# Patient Record
Sex: Female | Born: 1975 | Race: White | Hispanic: No | Marital: Married | Smoking: Current every day smoker
Health system: Southern US, Community
[De-identification: ages and names within clinical notes are randomized; demographics above are authoritative.]

## PROBLEM LIST (undated history)

## (undated) DIAGNOSIS — F32A Depression, unspecified: Secondary | ICD-10-CM

## (undated) DIAGNOSIS — F419 Anxiety disorder, unspecified: Secondary | ICD-10-CM

## (undated) DIAGNOSIS — F3110 Bipolar disorder, current episode manic without psychotic features, unspecified: Secondary | ICD-10-CM

## (undated) DIAGNOSIS — M797 Fibromyalgia: Secondary | ICD-10-CM

## (undated) DIAGNOSIS — J45909 Unspecified asthma, uncomplicated: Secondary | ICD-10-CM

## (undated) DIAGNOSIS — E119 Type 2 diabetes mellitus without complications: Secondary | ICD-10-CM

## (undated) DIAGNOSIS — C189 Malignant neoplasm of colon, unspecified: Secondary | ICD-10-CM

## (undated) DIAGNOSIS — F29 Unspecified psychosis not due to a substance or known physiological condition: Secondary | ICD-10-CM

## (undated) HISTORY — DX: Unspecified psychosis not due to a substance or known physiological condition: F29

## (undated) HISTORY — DX: Depression, unspecified: F32.A

## (undated) HISTORY — DX: Malignant neoplasm of colon, unspecified: C18.9

## (undated) HISTORY — DX: Anxiety disorder, unspecified: F41.9

## (undated) HISTORY — DX: Type 2 diabetes mellitus without complications: E11.9

---

## 2021-12-12 ENCOUNTER — Inpatient Hospital Stay
Admission: EM | Admit: 2021-12-12 | Discharge: 2021-12-14 | DRG: 375 | Disposition: A | Discharge status: Home / Self Care | Payer: Medicare Other | Attending: Internal Medicine | Admitting: Internal Medicine

## 2021-12-12 ENCOUNTER — Encounter: Payer: Self-pay | Admitting: Emergency Medicine

## 2021-12-12 ENCOUNTER — Other Ambulatory Visit: Payer: Self-pay

## 2021-12-12 ENCOUNTER — Emergency Department: Payer: Medicare Other

## 2021-12-12 ENCOUNTER — Inpatient Hospital Stay: Payer: Medicare Other

## 2021-12-12 DIAGNOSIS — Z882 Allergy status to sulfonamides status: Secondary | ICD-10-CM | POA: Diagnosis not present

## 2021-12-12 DIAGNOSIS — R109 Unspecified abdominal pain: Secondary | ICD-10-CM | POA: Diagnosis present

## 2021-12-12 DIAGNOSIS — C787 Secondary malignant neoplasm of liver and intrahepatic bile duct: Secondary | ICD-10-CM | POA: Diagnosis present

## 2021-12-12 DIAGNOSIS — Z888 Allergy status to other drugs, medicaments and biological substances status: Secondary | ICD-10-CM | POA: Diagnosis not present

## 2021-12-12 DIAGNOSIS — C189 Malignant neoplasm of colon, unspecified: Principal | ICD-10-CM | POA: Diagnosis present

## 2021-12-12 DIAGNOSIS — C7800 Secondary malignant neoplasm of unspecified lung: Secondary | ICD-10-CM | POA: Diagnosis not present

## 2021-12-12 DIAGNOSIS — M797 Fibromyalgia: Secondary | ICD-10-CM | POA: Diagnosis present

## 2021-12-12 DIAGNOSIS — J45909 Unspecified asthma, uncomplicated: Secondary | ICD-10-CM | POA: Diagnosis not present

## 2021-12-12 DIAGNOSIS — F17210 Nicotine dependence, cigarettes, uncomplicated: Secondary | ICD-10-CM | POA: Diagnosis not present

## 2021-12-12 DIAGNOSIS — D72829 Elevated white blood cell count, unspecified: Secondary | ICD-10-CM | POA: Diagnosis present

## 2021-12-12 DIAGNOSIS — K5900 Constipation, unspecified: Secondary | ICD-10-CM | POA: Diagnosis not present

## 2021-12-12 DIAGNOSIS — Z20822 Contact with and (suspected) exposure to covid-19: Secondary | ICD-10-CM | POA: Diagnosis not present

## 2021-12-12 DIAGNOSIS — Z6841 Body Mass Index (BMI) 40.0 and over, adult: Secondary | ICD-10-CM | POA: Diagnosis not present

## 2021-12-12 DIAGNOSIS — F319 Bipolar disorder, unspecified: Secondary | ICD-10-CM | POA: Diagnosis not present

## 2021-12-12 DIAGNOSIS — E785 Hyperlipidemia, unspecified: Secondary | ICD-10-CM | POA: Diagnosis present

## 2021-12-12 DIAGNOSIS — Z880 Allergy status to penicillin: Secondary | ICD-10-CM | POA: Diagnosis not present

## 2021-12-12 DIAGNOSIS — Z881 Allergy status to other antibiotic agents status: Secondary | ICD-10-CM | POA: Diagnosis not present

## 2021-12-12 DIAGNOSIS — R9389 Abnormal findings on diagnostic imaging of other specified body structures: Secondary | ICD-10-CM

## 2021-12-12 DIAGNOSIS — E119 Type 2 diabetes mellitus without complications: Secondary | ICD-10-CM | POA: Diagnosis present

## 2021-12-12 DIAGNOSIS — Z803 Family history of malignant neoplasm of breast: Secondary | ICD-10-CM | POA: Diagnosis not present

## 2021-12-12 HISTORY — DX: Unspecified asthma, uncomplicated: J45.909

## 2021-12-12 HISTORY — DX: Fibromyalgia: M79.7

## 2021-12-12 HISTORY — DX: Bipolar disorder, current episode manic without psychotic features, unspecified: F31.10

## 2021-12-12 LAB — CBC
HCT: 42.5 % (ref 36.0–46.0)
Hemoglobin: 14.2 g/dL (ref 12.0–15.0)
MCH: 27.2 pg (ref 26.0–34.0)
MCHC: 33.4 g/dL (ref 30.0–36.0)
MCV: 81.3 fL (ref 80.0–100.0)
Platelets: 316 10*3/uL (ref 150–400)
RBC: 5.23 MIL/uL — ABNORMAL HIGH (ref 3.87–5.11)
RDW: 13 % (ref 11.5–15.5)
WBC: 11.7 10*3/uL — ABNORMAL HIGH (ref 4.0–10.5)
nRBC: 0 % (ref 0.0–0.2)

## 2021-12-12 LAB — RESP PANEL BY RT-PCR (FLU A&B, COVID) ARPGX2
Influenza A by PCR: NEGATIVE
Influenza B by PCR: NEGATIVE
SARS Coronavirus 2 by RT PCR: NEGATIVE

## 2021-12-12 LAB — PREGNANCY, URINE: Preg Test, Ur: NEGATIVE

## 2021-12-12 LAB — URINALYSIS, ROUTINE W REFLEX MICROSCOPIC
Bilirubin Urine: NEGATIVE
Glucose, UA: NEGATIVE mg/dL
Hgb urine dipstick: NEGATIVE
Ketones, ur: NEGATIVE mg/dL
Leukocytes,Ua: NEGATIVE
Nitrite: NEGATIVE
Specific Gravity, Urine: 1.02 (ref 1.005–1.030)
pH: 6 (ref 5.0–8.0)

## 2021-12-12 LAB — COMPREHENSIVE METABOLIC PANEL
ALT: 14 U/L (ref 0–44)
AST: 23 U/L (ref 15–41)
Albumin: 3.8 g/dL (ref 3.5–5.0)
Alkaline Phosphatase: 95 U/L (ref 38–126)
Anion gap: 10 (ref 5–15)
BUN: 12 mg/dL (ref 6–20)
CO2: 26 mmol/L (ref 22–32)
Calcium: 9.5 mg/dL (ref 8.9–10.3)
Chloride: 99 mmol/L (ref 98–111)
Creatinine, Ser: 0.85 mg/dL (ref 0.44–1.00)
GFR, Estimated: >60 mL/min (ref >60)
Glucose, Bld: 124 mg/dL — ABNORMAL HIGH (ref 70–99)
Potassium: 4.5 mmol/L (ref 3.5–5.1)
Sodium: 135 mmol/L (ref 135–145)
Total Bilirubin: 0.7 mg/dL (ref 0.3–1.2)
Total Protein: 7.3 g/dL (ref 6.5–8.1)

## 2021-12-12 LAB — URINALYSIS, MICROSCOPIC (REFLEX)

## 2021-12-12 LAB — LIPASE, BLOOD: Lipase: 27 U/L (ref 11–51)

## 2021-12-12 LAB — PROCALCITONIN: Procalcitonin: <0.1 ng/mL

## 2021-12-12 MED ORDER — LACTATED RINGERS IV BOLUS
1000.0000 mL | Freq: Once | INTRAVENOUS | Status: AC
  Administered 2021-12-12: 20:00:00 1000 mL via INTRAVENOUS

## 2021-12-12 MED ORDER — ACETAMINOPHEN 325 MG PO TABS: 650.0000 mg | ORAL_TABLET | Freq: Four times a day (QID) | ORAL | Status: DC | PRN

## 2021-12-12 MED ORDER — ENOXAPARIN SODIUM 60 MG/0.6ML IJ SOSY
0.5000 mg/kg | PREFILLED_SYRINGE | INTRAMUSCULAR | Status: DC
  Administered 2021-12-12: 23:00:00 57.5 mg via SUBCUTANEOUS
  Filled 2021-12-12: qty 0.6

## 2021-12-12 MED ORDER — ONDANSETRON HCL 4 MG/2ML IJ SOLN
4.0000 mg | Freq: Once | INTRAMUSCULAR | Status: AC
  Administered 2021-12-12: 20:00:00 4 mg via INTRAVENOUS
  Filled 2021-12-12: qty 2

## 2021-12-12 MED ORDER — ONDANSETRON HCL 4 MG PO TABS: 4.0000 mg | ORAL_TABLET | Freq: Four times a day (QID) | ORAL | Status: DC | PRN

## 2021-12-12 MED ORDER — MAGNESIUM HYDROXIDE 400 MG/5ML PO SUSP: 30.0000 mL | Freq: Every day | ORAL | Status: DC | PRN

## 2021-12-12 MED ORDER — MORPHINE SULFATE (PF) 4 MG/ML IV SOLN
4.0000 mg | Freq: Once | INTRAVENOUS | Status: AC
  Administered 2021-12-12: 22:00:00 4 mg via INTRAVENOUS
  Filled 2021-12-12: qty 1

## 2021-12-12 MED ORDER — ACETAMINOPHEN 650 MG RE SUPP: 650.0000 mg | Freq: Four times a day (QID) | RECTAL | Status: DC | PRN

## 2021-12-12 MED ORDER — SODIUM CHLORIDE 0.9 % IV SOLN: INTRAVENOUS | Status: DC

## 2021-12-12 MED ORDER — MORPHINE SULFATE (PF) 2 MG/ML IV SOLN
2.0000 mg | INTRAVENOUS | Status: DC | PRN
  Administered 2021-12-13 – 2021-12-14 (×4): 2 mg via INTRAVENOUS
  Filled 2021-12-12 (×4): qty 1

## 2021-12-12 MED ORDER — MORPHINE SULFATE (PF) 4 MG/ML IV SOLN
4.0000 mg | Freq: Once | INTRAVENOUS | Status: AC
Start: 2021-12-12 — End: 2021-12-12
  Administered 2021-12-12: 20:00:00 4 mg via INTRAVENOUS
  Filled 2021-12-12: qty 1

## 2021-12-12 MED ORDER — ONDANSETRON HCL 4 MG/2ML IJ SOLN: 4.0000 mg | Freq: Four times a day (QID) | INTRAMUSCULAR | Status: DC | PRN

## 2021-12-12 MED ORDER — TRAZODONE HCL 50 MG PO TABS
25.0000 mg | ORAL_TABLET | Freq: Every evening | ORAL | Status: DC | PRN
  Administered 2021-12-13: 25 mg via ORAL
  Filled 2021-12-12: qty 1

## 2021-12-12 NOTE — ED Provider Notes (Signed)
[facility] Provider Note    Event Date/Time   First MD Initiated Contact with Patient 12/12/21 [name]     (approximate)   History   Abdominal Pain   HPI  Jennifer Ramirez is a 46 y.o. female with a past medical history of prior C-section, bipolar order, PTSD, asthma and diabetes who presents for assessment of some generalized abdominal pain associate with nausea and vomiting as well some constipation that started yesterday.  Patient states she had extremely hard stool this morning and feels she has been struggling constipation all this past month without any relief from over-the-counter stool softeners and laxative.  She states the pain is everywhere.  She denies any fevers, chest pain, cough, shortness of breath, back pain, burning with urination, vaginal bleeding or discharge or any blood in her stool.  No prior similar episodes.  No alleviating or aggravating factors.  She does take ibuprofen intermittently but not any significant daily and denies EtOH use.  She still has her gallbladder appendix.      Physical Exam  Triage Vital Signs: ED Triage Vitals  Enc Vitals Group     BP 12/12/21 1809 128/85     Pulse Rate 12/12/21 1809 98     Resp 12/12/21 1809 19     Temp 12/12/21 1809 97.8 F (36.6 C)     Temp Source 12/12/21 1809 Oral     SpO2 12/12/21 1809 96 %     Weight 12/12/21 1805 249 lb (112.9 kg)     Height 12/12/21 1805 5\' 6"  (1.676 m)     Head Circumference --      Peak Flow --      Pain Score 12/12/21 1805 8     Pain Loc --      Pain Edu? --      Excl. in [place]? --     Most recent vital signs: Vitals:   12/12/21 2123 12/12/21 2130  BP: 100/65 112/66  Pulse: 63 66  Resp: 20 18  Temp:  98 F (36.7 C)  SpO2: 97% 99%    General: Awake, no distress.  Appears uncomfortable. CV:  Good peripheral perfusion.  2+ radial pulses.  No murmurs rubs or gallops. Resp:  Normal effort.  Abd:  No distention.  Tender throughout.  No significant CVA  tenderness. O   ED Results / Procedures / Treatments  Labs (all labs ordered are listed, but only abnormal results are displayed) Labs Reviewed  COMPREHENSIVE METABOLIC PANEL - Abnormal; Notable for the following components:      Result Value   Glucose, Bld 124 (*)    All other components within normal limits  CBC - Abnormal; Notable for the following components:   WBC 11.7 (*)    RBC 5.23 (*)    All other components within normal limits  URINALYSIS, ROUTINE W REFLEX MICROSCOPIC - Abnormal; Notable for the following components:   APPearance HAZY (*)    Protein, ur TRACE (*)    All other components within normal limits  URINALYSIS, MICROSCOPIC (REFLEX) - Abnormal; Notable for the following components:   Bacteria, UA MANY (*)    All other components within normal limits  RESP PANEL BY RT-PCR (FLU A&B, COVID) [id number]  URINE CULTURE  LIPASE, BLOOD  PREGNANCY, URINE  PROCALCITONIN  CEA  BASIC METABOLIC PANEL  CBC  HIV ANTIBODY (ROUTINE TESTING W REFLEX)  POC URINE PREG, ED     EKG    RADIOLOGY  CT abdomen pelvis ordered  by myself shows lesions in the colon concerning for neoplastic process.  No evidence of appendicitis or SBO.  There are some rounded lesions in the liver concerning for metastatic disease.  There is evidence of aortic atherosclerosis.  No other acute abdominal pelvic process.  I also reviewed radiology interpretation and agree with their findings.   PROCEDURES:  Critical Care performed: No  Procedures    MEDICATIONS ORDERED IN ED: Medications  enoxaparin (LOVENOX) injection 57.5 mg (has no administration in time range)  0.9 %  sodium chloride infusion (has no administration in time range)  acetaminophen (TYLENOL) tablet 650 mg (has no administration in time range)    Or  acetaminophen (TYLENOL) suppository 650 mg (has no administration in time range)  traZODone (DESYREL) tablet 25 mg (has no administration in time range)  magnesium hydroxide  (MILK OF MAGNESIA) suspension 30 mL (has no administration in time range)  ondansetron (ZOFRAN) tablet 4 mg (has no administration in time range)    Or  ondansetron (ZOFRAN) injection 4 mg (has no administration in time range)  morphine 2 MG/ML injection 2 mg (has no administration in time range)  lactated ringers bolus 1,000 mL (1,000 mLs Intravenous New Bag/Given 12/12/21 1959)  ondansetron (ZOFRAN) injection 4 mg (4 mg Intravenous Given 12/12/21 1958)  morphine 4 MG/ML injection 4 mg (4 mg Intravenous Given 12/12/21 1959)  morphine 4 MG/ML injection 4 mg (4 mg Intravenous Given 12/12/21 2131)     IMPRESSION / MDM / ASSESSMENT AND PLAN / ED COURSE  I reviewed the triage vital signs and the nursing notes.                              Differential diagnosis includes, but is not limited to appendicitis, SBO, cholecystitis, pancreatitis, diverticulitis, symptomatic constipation, cystitis, kidney stone and metabolic derangements  CMP shows no significant electrolyte or metabolic derangements.  No evidence of hepatitis or cholestatic process.  Lipase not consistent with acute pancreatitis.  CBC with WBC count of 11.7 without evidence of acute anemia and normal platelets.  Pregnancy test is negative.  UA has some bacteria and WBCs but fairly significant squamous epithelial cells concerning for contamination and have a low suspicion for clinically significant cystitis given patient is denying any urinary symptoms.  CT abdomen pelvis ordered by myself shows lesions in the colon concerning for neoplastic process.  No evidence of appendicitis or SBO.  There are some rounded lesions in the liver concerning for metastatic disease.  There is evidence of aortic atherosclerosis.  No other acute abdominal pelvic process.  I also reviewed radiology interpretation and agree with their findings.  I suspect findings reflecting likely undiagnosed metastatic colon cancer or primary etiology of patient's symptoms.  I  discussed these findings with on-call oncologist Dr. Janese Banks who recommended CT chest without to complete patient staging work-up and CEA as well as medicine admission for possible IR biopsy tomorrow.  I discussed this with the hospitalist and placed these orders.  Hospitalist will place admission orders.  Patient will be admitted to medicine service.      FINAL CLINICAL IMPRESSION(S) / ED DIAGNOSES   Final diagnoses:  Abnormal CT scan  Metastatic colon cancer in female [facility])     Rx / DC Orders   ED Discharge Orders     None        Note:  This document was prepared using Dragon voice recognition software and may include unintentional dictation  errors.   Lucrezia Starch, MD 12/12/21 [phone]

## 2021-12-12 NOTE — Progress Notes (Signed)
PHARMACIST - PHYSICIAN COMMUNICATION  CONCERNING:  Enoxaparin (Lovenox) for DVT Prophylaxis    RECOMMENDATION: Patient was prescribed enoxaparin 40mg  q24 hours for VTE prophylaxis.   Filed Weights   12/12/21 1805  Weight: 112.9 kg (249 lb)    Body mass index is 40.19 kg/m.  Estimated Creatinine Clearance: 104.2 mL/min (by C-G formula based on SCr of 0.85 mg/dL).   Based on [place] patient is candidate for enoxaparin 0.5mg /kg TBW SQ every 24 hours based on BMI being >30.  DESCRIPTION: Pharmacy has adjusted enoxaparin dose per [facility] policy.  Patient is now receiving enoxaparin 57.5 mg every 24 hours   Benita Gutter 12/12/2021 9:51 PM

## 2021-12-12 NOTE — Plan of Care (Signed)

## 2021-12-12 NOTE — H&P (Addendum)
[place]   PATIENT NAME: Jennifer Ramirez    MR#:  [id number]  DATE OF BIRTH:  1976-10-01  DATE OF ADMISSION:  12/12/2021  PRIMARY CARE PHYSICIAN: Pcp, No   Patient is coming from: Home  REQUESTING/REFERRING PHYSICIAN: Hulan Saas, MD  CHIEF COMPLAINT:   Chief Complaint  Patient presents with   Abdominal Pain    HISTORY OF PRESENT ILLNESS:  Jennifer Ramirez is a 46 y.o. female with medical history significant for asthma, type II diabetes mellitus, ongoing tobacco abuse, bipolar disorder and fibromyalgia who presented to the ER with acute onset of generalized abdominal pain with associated nausea and vomiting that started yesterday.  She has been having constipation though for the last month and this morning had very hard stool.  She has tried over-the-counter stool softeners and laxatives without much help.  No melena or bright red bleeding per rectum.  No bilious vomitus or hematemesis.  She denied any dyspnea or cough or wheezing.  No chest pain or palpitations.  No fever or chills.  No dysuria, oliguria or hematuria or flank pain.  No bleeding diathesis.  ED Course: When she came to the ER vital signs were within normal.  Labs revealed mild leukocytosis of 11.7.  Urine pregnancy test is negative.  CMP was unremarkable.  Imaging: Abdominal and pelvic CT scan revealed: 1. Findings most consistent with colonic malignancy involving the mid descending colon with regional nodal and hepatic metastatic disease. 2. Diffuse pericolonic haziness centered at the splenic flexure may be reactive. Clinical correlation is recommended to exclude colitis. 3. Aortic Atherosclerosis.  The patient was given 4 mg of IV morphine sulfate and 4 mg of IV Zofran as well as 1 L bolus of IV lactated Ringer.  She will be admitted to a medical bed for further evaluation and management.  PAST MEDICAL HISTORY:   Past Medical History:  Diagnosis Date   Asthma    Bipolar disease, manic ([place])     Fibromyalgia   -Type 2 diabetes mellitus  PAST SURGICAL HISTORY:   Past Surgical History:  Procedure Laterality Date   CESAREAN SECTION      SOCIAL HISTORY:   Social History   Tobacco Use   Smoking status: Every Day    Types: Cigarettes   Smokeless tobacco: Never  Substance Use Topics   Alcohol use: Yes    FAMILY HISTORY:   Positive for MI in both grandfathers.  Her grandmother had breast cancer.  DRUG ALLERGIES:   Allergies  Allergen Reactions   Amoxicillin Anaphylaxis   Ivp Dye [Iodinated Contrast Media] Anaphylaxis    Pt states her "airway swells shut"   Keflex [Cephalexin] Nausea And Vomiting   Lidocaine Hives    Lidocaine and novacaine.   Sulfa Antibiotics Other (See Comments)    Stroke like symptoms    REVIEW OF SYSTEMS:   ROS As per history of present illness. All pertinent systems were reviewed above. Constitutional, HEENT, cardiovascular, respiratory, GI, GU, musculoskeletal, neuro, psychiatric, endocrine, integumentary and hematologic systems were reviewed and are otherwise negative/unremarkable except for positive findings mentioned above in the HPI.   MEDICATIONS AT HOME:   Prior to Admission medications   Not on File      VITAL SIGNS:  Blood pressure 112/66, pulse 66, temperature 98 F (36.7 C), temperature source Oral, resp. rate 18, height 5\' 6"  (1.676 m), weight 112.9 kg, last menstrual period 12/06/2021, SpO2 99 %.  PHYSICAL EXAMINATION:  Physical Exam  GENERAL:  46 y.o.-year-old  patient lying in the bed with no acute distress.  EYES: Pupils equal, round, reactive to light and accommodation. No scleral icterus. Extraocular muscles intact.  HEENT: Head atraumatic, normocephalic. Oropharynx and nasopharynx clear.  NECK:  Supple, no jugular venous distention. No thyroid enlargement, no tenderness.  LUNGS: Normal breath sounds bilaterally, no wheezing, rales,rhonchi or crepitation. No use of accessory muscles of respiration.   CARDIOVASCULAR: Regular rate and rhythm, S1, S2 normal. No murmurs, rubs, or gallops.  ABDOMEN: Soft, nondistended, with generalized tenderness without rebound/guarding or rigidity.  Bowel sounds present. No organomegaly or mass.  EXTREMITIES: No pedal edema, cyanosis, or clubbing.  NEUROLOGIC: Cranial nerves II through XII are intact. Muscle strength 5/5 in all extremities. Sensation intact. Gait not checked.  PSYCHIATRIC: The patient is alert and oriented x 3.  Normal affect and good eye contact. SKIN: No obvious rash, lesion, or ulcer.   LABORATORY PANEL:   CBC Recent Labs  Lab 12/12/21 1808  WBC 11.7*  HGB 14.2  HCT 42.5  PLT 316   ------------------------------------------------------------------------------------------------------------------  Chemistries  Recent Labs  Lab 12/12/21 1808  NA 135  K 4.5  CL 99  CO2 26  GLUCOSE 124*  BUN 12  CREATININE 0.85  CALCIUM 9.5  AST 23  ALT 14  ALKPHOS 95  BILITOT 0.7   ------------------------------------------------------------------------------------------------------------------  Cardiac Enzymes No results for input(s): TROPONINI in the last 168 hours. ------------------------------------------------------------------------------------------------------------------  RADIOLOGY:  CT ABDOMEN PELVIS WO CONTRAST  Result Date: 12/12/2021 CLINICAL DATA:  Abdominal pain. EXAM: CT ABDOMEN AND PELVIS WITHOUT CONTRAST TECHNIQUE: Multidetector CT imaging of the abdomen and pelvis was performed following the standard protocol without IV contrast. RADIATION DOSE REDUCTION: This exam was performed according to the departmental dose-optimization program which includes automated exposure control, adjustment of the mA and/or kV according to patient size and/or use of iterative reconstruction technique. COMPARISON:  None. FINDINGS: Evaluation of this exam is limited in the absence of intravenous contrast. Lower chest: The visualized lung  bases are clear. No intra-abdominal free air or free fluid. Hepatobiliary: Multiple hepatic hypodense lesions most consistent with metastatic disease. Correlation with known malignancy recommended. These measure up to 4.7 x 4.8 cm in the right lobe of the liver. No intrahepatic biliary ductal dilatation. The gallbladder is unremarkable. Pancreas: Unremarkable. No pancreatic ductal dilatation or surrounding inflammatory changes. Spleen: Normal in size without focal abnormality. Adrenals/Urinary Tract: The adrenal glands unremarkable. The kidneys, visualized ureters, and urinary bladder appear unremarkable. Stomach/Bowel: There is focal area of circumferential thickening involving the mid descending colon extending approximately 4.5 cm in length most concerning for malignancy. There is moderate stool throughout the colon. There is haziness of the pericolonic fat centered at the splenic flexure. Clinical correlation is recommended to evaluate for possibility of stercoral colitis. There is no bowel obstruction. The appendix is normal. Vascular/Lymphatic: Mild aortoiliac atherosclerotic disease. The IVC is unremarkable. No portal venous gas. Pericolonic adenopathy with largest lymph node measures approximately 2.2 cm in short axis medial to the mid descending colon. Reproductive: The uterus is anteverted and grossly unremarkable. Small left ovarian follicle. Other: None Musculoskeletal: No acute or significant osseous findings. IMPRESSION: 1. Findings most consistent with colonic malignancy involving the mid descending colon with regional nodal and hepatic metastatic disease. 2. Diffuse pericolonic haziness centered at the splenic flexure may be reactive. Clinical correlation is recommended to exclude colitis. 3. Aortic Atherosclerosis (ICD10-I70.0). Electronically Signed   By: Anner Crete M.D.   On: 12/12/2021 20:56      IMPRESSION AND PLAN:  Principal Problem:   Metastatic colon cancer to liver [facility]) Active  Problems:   Abdominal pain  1.  Generalized abdominal pain and abnormal CT scan revealing suspected metastatic colon cancer with nodal and hepatic metastasis. - The patient will be admitted to a medical bed. - Pain management will be provided. - We will obtain a chest CT without contrast for further staging. - Oncology consult will be obtained. - Dr. Janese Banks was notified about the patient. - She will arrange with IR ultrasound-guided biopsy in AM.  2.  Leukocytosis, likely stress demargination. -We will follow CBC.  3.  Type II obese mellitus without complications. - The patient will be placed on supplement coverage with NovoLog. - She has not taken metformin in 3 months. - We will still hold off while she is here.  4.  Dyslipidemia. - This is apparently diet managed.  5.  Asthma.-This is currently controlled.  6.  Ongoing tobacco abuse. - I counseled her for smoking cessation and she will receive further counseling care.  DVT prophylaxis: Lovenox.  Code Status: full code.  Family Communication:  The plan of care was discussed in details with the patient (and family). I answered all questions. The patient agreed to proceed with the above mentioned plan. Further management will depend upon hospital course. Disposition Plan: Back to previous home environment Consults called: Oncology. All the records are reviewed and case discussed with ED provider.  Status is: Inpatient   At the time of the admission, it appears that the appropriate admission status for this patient is inpatient.  This is judged to be reasonable and necessary in order to provide the required intensity of service to ensure the patient's safety given the presenting symptoms, physical exam findings and initial radiographic and laboratory data in the context of comorbid conditions.  The patient requires inpatient status due to high intensity of service, high risk of further deterioration and high frequency of  surveillance required.  I certify that at the time of admission, it is my clinical judgment that the patient will require inpatient hospital care extending more than 2 midnights.                            Dispo: The patient is from: Home              Anticipated d/c is to: Home              Patient currently is not medically stable to d/c.              Difficult to place patient: No  Christel Mormon M.D on 12/12/2021 at 9:58 PM  [facility]   From 7 PM-7 AM, contact night-coverage [email or web address]  CC: Primary care physician; Pcp, No

## 2021-12-12 NOTE — ED Triage Notes (Signed)
Pt comes into the ED via POV c/o right side abd pain that started yesterday.  Pt admits to nausea and vomiting as well.  Last BM was yesterday morning and per the patient the stool appeared as though she was having problems with constipation.  Pt bent over in triage and appears uncomfortable.  Pt has even and unlabored respirations at this time.

## 2021-12-13 DIAGNOSIS — C189 Malignant neoplasm of colon, unspecified: Principal | ICD-10-CM

## 2021-12-13 DIAGNOSIS — C787 Secondary malignant neoplasm of liver and intrahepatic bile duct: Secondary | ICD-10-CM

## 2021-12-13 DIAGNOSIS — R1011 Right upper quadrant pain: Secondary | ICD-10-CM

## 2021-12-13 LAB — BASIC METABOLIC PANEL
Anion gap: 7 (ref 5–15)
BUN: 12 mg/dL (ref 6–20)
CO2: 26 mmol/L (ref 22–32)
Calcium: 8.6 mg/dL — ABNORMAL LOW (ref 8.9–10.3)
Chloride: 102 mmol/L (ref 98–111)
Creatinine, Ser: 0.75 mg/dL (ref 0.44–1.00)
GFR, Estimated: >60 mL/min (ref >60)
Glucose, Bld: 114 mg/dL — ABNORMAL HIGH (ref 70–99)
Potassium: 3.8 mmol/L (ref 3.5–5.1)
Sodium: 135 mmol/L (ref 135–145)

## 2021-12-13 LAB — HEMOGLOBIN A1C
Hgb A1c MFr Bld: 7.4 % — ABNORMAL HIGH (ref 4.8–5.6)
Mean Plasma Glucose: 166 mg/dL

## 2021-12-13 LAB — CBC
HCT: 37.1 % (ref 36.0–46.0)
Hemoglobin: 12.2 g/dL (ref 12.0–15.0)
MCH: 26.5 pg (ref 26.0–34.0)
MCHC: 32.9 g/dL (ref 30.0–36.0)
MCV: 80.7 fL (ref 80.0–100.0)
Platelets: 272 10*3/uL (ref 150–400)
RBC: 4.6 MIL/uL (ref 3.87–5.11)
RDW: 13.1 % (ref 11.5–15.5)
WBC: 9.7 10*3/uL (ref 4.0–10.5)
nRBC: 0 % (ref 0.0–0.2)

## 2021-12-13 LAB — GLUCOSE, CAPILLARY
Glucose-Capillary: 116 mg/dL — ABNORMAL HIGH (ref 70–99)
Glucose-Capillary: 121 mg/dL — ABNORMAL HIGH (ref 70–99)
Glucose-Capillary: 111 mg/dL — ABNORMAL HIGH (ref 70–99)
Glucose-Capillary: 121 mg/dL — ABNORMAL HIGH (ref 70–99)

## 2021-12-13 LAB — HIV ANTIBODY (ROUTINE TESTING W REFLEX): HIV Screen 4th Generation wRfx: NONREACTIVE

## 2021-12-13 MED ORDER — LACTULOSE 10 GM/15ML PO SOLN
20.0000 g | Freq: Once | ORAL | Status: AC
  Administered 2021-12-13: 20 g via ORAL
  Filled 2021-12-13: qty 30

## 2021-12-13 MED ORDER — SENNOSIDES-DOCUSATE SODIUM 8.6-50 MG PO TABS
2.0000 | ORAL_TABLET | Freq: Two times a day (BID) | ORAL | Status: DC
  Administered 2021-12-13 (×2): 2 via ORAL
  Filled 2021-12-13 (×2): qty 2

## 2021-12-13 MED ORDER — INSULIN ASPART 100 UNIT/ML IJ SOLN
0.0000 [IU] | Freq: Three times a day (TID) | INTRAMUSCULAR | Status: DC
  Administered 2021-12-13 – 2021-12-14 (×2): 1 [IU] via SUBCUTANEOUS
  Filled 2021-12-13 (×2): qty 1

## 2021-12-13 NOTE — Plan of Care (Signed)

## 2021-12-13 NOTE — Consult Note (Signed)
Chief Complaint: Patient was seen in consultation today for  Chief Complaint  Patient presents with   Abdominal Pain    Referring Physician(s): Dr. Janese Banks   Supervising Physician: Markus Daft  Patient Status: [facility] - In-pt  History of Present Illness: Jennifer Ramirez is a 46 y.o. female with a medical history significant for asthma, DM2, tobacco abuse, bipolar disorder and fibromyalgia. She presented to the ED 12/12/21 with acute onset of generalized abdominal pain, nausea and vomiting. Labs revealed mild leukocytosis and imaging revealed findings consistent with colon cancer with hepatic metastatic disease.  CT abdomen/pelvis without contrast 12/12/21 Hepatobiliary: Multiple hepatic hypodense lesions most consistent with metastatic disease. Correlation with known malignancy recommended. These measure up to 4.7 x 4.8 cm in the right lobe of the liver. No intrahepatic biliary ductal dilatation. The gallbladder is unremarkable. IMPRESSION: 1. Findings most consistent with colonic malignancy involving the mid descending colon with regional nodal and hepatic metastatic disease. 2. Diffuse pericolonic haziness centered at the splenic flexure may be reactive. Clinical correlation is recommended to exclude colitis. 3. Aortic Atherosclerosis (ICD10-I70.0).  Interventional Radiology has been asked to evaluate this patient for an image-guided liver lesion biopsy. Imaging reviewed and procedure approved by Dr. Anselm Pancoast.   Past Medical History:  Diagnosis Date   Asthma    Bipolar disease, manic ([place])    Fibromyalgia     Past Surgical History:  Procedure Laterality Date   CESAREAN SECTION      Allergies: Amoxicillin, Ivp dye [iodinated contrast media], Keflex [cephalexin], Lidocaine, and Sulfa antibiotics  Medications: Prior to Admission medications   Not on File     History reviewed. No pertinent family history.  Social History   Socioeconomic History   Marital status:  Married    Spouse name: Not on file   Number of children: Not on file   Years of education: Not on file   Highest education level: Not on file  Occupational History   Not on file  Tobacco Use   Smoking status: Every Day    Types: Cigarettes   Smokeless tobacco: Never  Substance and Sexual Activity   Alcohol use: Yes   Drug use: Not on file    Comment: occasional   Sexual activity: Not on file  Other Topics Concern   Not on file  Social History Narrative   Not on file   Social Determinants of Health   Financial Resource Strain: Not on file  Food Insecurity: Not on file  Transportation Needs: Not on file  Physical Activity: Not on file  Stress: Not on file  Social Connections: Not on file    Review of Systems: A 12 point ROS discussed and pertinent positives are indicated in the HPI above.  All other systems are negative.  Review of Systems  Constitutional:  Negative for appetite change and fatigue.  Respiratory:  Negative for cough and shortness of breath.   Cardiovascular:  Negative for chest pain and leg swelling.  Gastrointestinal:  Positive for abdominal pain and diarrhea. Negative for nausea and vomiting.  Neurological:  Negative for dizziness and headaches.   Vital Signs: BP 92/64 (BP Location: Left Arm)    Pulse 69    Temp 98.4 F (36.9 C)    Resp 16    Ht 5\' 6"  (1.676 m)    Wt 249 lb (112.9 kg)    LMP 12/06/2021    SpO2 96%    BMI 40.19 kg/m   Physical Exam Constitutional:  General: She is not in acute distress.    Appearance: She is not ill-appearing.  HENT:     Mouth/Throat:     Mouth: Mucous membranes are moist.     Pharynx: Oropharynx is clear.     Comments: Poor dental hygiene; many missing teeth.  Cardiovascular:     Rate and Rhythm: Normal rate and regular rhythm.     Pulses: Normal pulses.     Heart sounds: Normal heart sounds.  Pulmonary:     Effort: Pulmonary effort is normal.     Breath sounds: Normal breath sounds.  Abdominal:      General: Bowel sounds are normal.     Palpations: Abdomen is soft.     Tenderness: There is abdominal tenderness.     Comments: Generalized discomfort/tenderness.   Musculoskeletal:        General: Normal range of motion.  Skin:    General: Skin is warm and dry.  Neurological:     Mental Status: She is alert and oriented to person, place, and time.    Imaging: CT ABDOMEN PELVIS WO CONTRAST  Result Date: 12/12/2021 CLINICAL DATA:  Abdominal pain. EXAM: CT ABDOMEN AND PELVIS WITHOUT CONTRAST TECHNIQUE: Multidetector CT imaging of the abdomen and pelvis was performed following the standard protocol without IV contrast. RADIATION DOSE REDUCTION: This exam was performed according to the departmental dose-optimization program which includes automated exposure control, adjustment of the mA and/or kV according to patient size and/or use of iterative reconstruction technique. COMPARISON:  None. FINDINGS: Evaluation of this exam is limited in the absence of intravenous contrast. Lower chest: The visualized lung bases are clear. No intra-abdominal free air or free fluid. Hepatobiliary: Multiple hepatic hypodense lesions most consistent with metastatic disease. Correlation with known malignancy recommended. These measure up to 4.7 x 4.8 cm in the right lobe of the liver. No intrahepatic biliary ductal dilatation. The gallbladder is unremarkable. Pancreas: Unremarkable. No pancreatic ductal dilatation or surrounding inflammatory changes. Spleen: Normal in size without focal abnormality. Adrenals/Urinary Tract: The adrenal glands unremarkable. The kidneys, visualized ureters, and urinary bladder appear unremarkable. Stomach/Bowel: There is focal area of circumferential thickening involving the mid descending colon extending approximately 4.5 cm in length most concerning for malignancy. There is moderate stool throughout the colon. There is haziness of the pericolonic fat centered at the splenic flexure. Clinical  correlation is recommended to evaluate for possibility of stercoral colitis. There is no bowel obstruction. The appendix is normal. Vascular/Lymphatic: Mild aortoiliac atherosclerotic disease. The IVC is unremarkable. No portal venous gas. Pericolonic adenopathy with largest lymph node measures approximately 2.2 cm in short axis medial to the mid descending colon. Reproductive: The uterus is anteverted and grossly unremarkable. Small left ovarian follicle. Other: None Musculoskeletal: No acute or significant osseous findings. IMPRESSION: 1. Findings most consistent with colonic malignancy involving the mid descending colon with regional nodal and hepatic metastatic disease. 2. Diffuse pericolonic haziness centered at the splenic flexure may be reactive. Clinical correlation is recommended to exclude colitis. 3. Aortic Atherosclerosis (ICD10-I70.0). Electronically Signed   By: Anner Crete M.D.   On: 12/12/2021 20:56   CT Chest Wo Contrast  Result Date: 12/12/2021 CLINICAL DATA:  Colon cancer, metastatic, staging. Colonic mass on CT earlier today. EXAM: CT CHEST WITHOUT CONTRAST TECHNIQUE: Multidetector CT imaging of the chest was performed following the standard protocol without IV contrast. RADIATION DOSE REDUCTION: This exam was performed according to the departmental dose-optimization program which includes automated exposure control, adjustment of the mA and/or kV  according to patient size and/or use of iterative reconstruction technique. COMPARISON:  No prior chest exams. FINDINGS: Cardiovascular: Normal caliber thoracic aorta. The heart is normal in size. There is no pericardial effusion. Mediastinum/Nodes: Small mediastinal lymph nodes measure up to 8 mm in the prevascular space, series 2, image 46. Non-specific 8 mm epicardial node series 2, image 88. Limited assessment for hilar adenopathy on this unenhanced exam. No esophageal wall thickening. No visualized thyroid nodule. Lungs/Pleura: Scattered  tiny pulmonary nodules both lungs, largest 4 mm in the right middle lobe, randomly distributed. The nodules are millimetric and nonspecific, however pulmonary metastasis are suspected in the setting. For example nodules are seen in the right upper lobe series 3, images 36, 40, and 64, right middle lobe series 3, images 64 and 76, left lower lobe series 3, image 72, and right lower lobe series 3, image 105. no confluent consolidation or pleural effusion. The trachea and central bronchi are patent. Upper Abdomen: Assessed on con abdominal CT earlier today, multiple liver masses. Current Musculoskeletal: Tiny sclerotic focus within T7, nonspecific but likely a bone island, too small to accurately characterize no destructive bone lesions. No chest wall soft tissue abnormalities. IMPRESSION: 1. Scattered tiny pulmonary nodules, largest 4 mm in the right middle lobe, nonspecific, however pulmonary metastasis are suspected in the setting of known malignancy. 2. Small mediastinal lymph nodes are not enlarged by size criteria, largest 8 mm, nonspecific. 3. Tiny sclerotic focus within T7, too small to accurately characterize. Electronically Signed   By: Keith Rake M.D.   On: 12/12/2021 22:30    Labs:  CBC: Recent Labs    12/12/21 1808 12/13/21 0441  WBC 11.7* 9.7  HGB 14.2 12.2  HCT 42.5 37.1  PLT 316 272    COAGS: No results for input(s): INR, APTT in the last 8760 hours.  BMP: Recent Labs    12/12/21 1808 12/13/21 0441  NA 135 135  K 4.5 3.8  CL 99 102  CO2 26 26  GLUCOSE 124* 114*  BUN 12 12  CALCIUM 9.5 8.6*  CREATININE 0.85 0.75  GFRNONAA >60 >60    LIVER FUNCTION TESTS: Recent Labs    12/12/21 1808  BILITOT 0.7  AST 23  ALT 14  ALKPHOS 95  PROT 7.3  ALBUMIN 3.8    TUMOR MARKERS: No results for input(s): AFPTM, CEA, CA199, CHROMGRNA in the last 8760 hours.  Assessment and Plan:  Suspected colon cancer with liver metastases: Jennifer Ramirez, 46 year old female, is  tentatively scheduled tomorrow for an image-guided liver lesion biopsy.  Risks and benefits of this procedure were discussed with the patient and/or patient's family including, but not limited to bleeding, infection, damage to adjacent structures or low yield requiring additional tests.  All of the questions were answered and there is agreement to proceed. She will be NPO at midnight. AM labs ordered. Lovenox will be held. The patient states she is allergic to lidocaine and novocaine. I called and spoke with pharmacist Manuela Schwartz who states we can use either tetracaine or chloroprocaine for local anesthesia. IR will order appropriate medication in the morning per radiologist's preference.   Consent signed and in chart.  Thank you for this interesting consult.  I greatly enjoyed meeting Jennifer Ramirez and look forward to participating in their care.  A copy of this report was sent to the requesting provider on this date.  Electronically Signed: Soyla Dryer, AGACNP-BC [phone] 12/13/2021, 8:34 AM   I spent a total of 20 Minutes  in face to face in clinical consultation, greater than 50% of which was counseling/coordinating care for liver lesion biopsy

## 2021-12-13 NOTE — Progress Notes (Signed)
PROGRESS NOTE    Jennifer Ramirez  GHW:[id number] DOB: 08-30-1976 DOA: 12/12/2021 PCP: Pcp, No   Chief complaint.  Abdominal pain. Brief Narrative:  Jennifer Ramirez is a 46 y.o. female with medical history significant for asthma, type II diabetes mellitus, ongoing tobacco abuse, bipolar disorder and fibromyalgia who presented to the ER with acute onset of generalized abdominal pain with associated nausea and vomiting. CT chest/abdomen/pelvis showed colonic malignancy with diffuse metastasis to liver and lungs. Oncology consult is obtained, scheduled for IR biopsy.   Assessment & Plan:   Principal Problem:   Metastatic colon cancer to liver [facility]) Active Problems:   Abdominal pain  Abdominal pain. Colon cancer with metastasis to liver and lungs. Constipation. Appeared due to metastatic cancer, constipation may also played a role. Patient will be seen by oncology, scheduled for biopsy tomorrow. Will give lactulose and scheduled senna for constipation.  Continue as needed pain medicine.  Type 2 diabetes. Continue coverage with sliding scale insulin.  Morbid obesity. Dyslipidemia Continue to follow.  Tobacco abuse. Advised to quit.   DVT prophylaxis: SCDs, Lovenox discontinued for scheduled biopsy. Code Status: Full Family Communication: Husband at bedside. Disposition Plan:    Status is: Inpatient  Remains inpatient appropriate because: Severity disease, pending procedure.        No intake/output data recorded. No intake/output data recorded.     Consultants:  Oncology.  Procedures: Pending liver biopsy.  Antimicrobials: None  Subjective: Patient still complaining of abdominal pain, localized in right upper quadrant, nausea, no vomiting. Has not had a bowel movement since Tuesday.  Feel constipated. Denies any short of breath or cough. No dysuria hematuria No fever or chills.  Objective: Vitals:   12/12/21 2227 12/13/21 0349 12/13/21 0800  12/13/21 0840  BP: 114/74 92/64 (!) 96/54 (!) 101/55  Pulse: 75 69 71   Resp: 18 16 16    Temp: 98 F (36.7 C) 98.4 F (36.9 C) 98.3 F (36.8 C)   TempSrc:   Oral   SpO2: 98% 96% 95%   Weight:      Height:       No intake or output data in the 24 hours ending 12/13/21 1005 Filed Weights   12/12/21 1805  Weight: 112.9 kg    Examination:  General exam: Appears calm and comfortable, morbid obese. Respiratory system: Clear to auscultation. Respiratory effort normal. Cardiovascular system: S1 & S2 heard, RRR. No JVD, murmurs, rubs, gallops or clicks. No pedal edema. Gastrointestinal system: Abdomen is nondistended, soft and RUQ tender. No organomegaly or masses felt. Normal bowel sounds heard. Central nervous system: Alert and oriented. No focal neurological deficits. Extremities: Symmetric 5 x 5 power. Skin: No rashes, lesions or ulcers Psychiatry: Judgement and insight appear normal. Mood & affect appropriate.     Data Reviewed: I have personally reviewed following labs and imaging studies  CBC: Recent Labs  Lab 12/12/21 1808 12/13/21 0441  WBC 11.7* 9.7  HGB 14.2 12.2  HCT 42.5 37.1  MCV 81.3 80.7  PLT 316 789   Basic Metabolic Panel: Recent Labs  Lab 12/12/21 1808 12/13/21 0441  NA 135 135  K 4.5 3.8  CL 99 102  CO2 26 26  GLUCOSE 124* 114*  BUN 12 12  CREATININE 0.85 0.75  CALCIUM 9.5 8.6*   GFR: Estimated Creatinine Clearance: 110.8 mL/min (by C-G formula based on SCr of 0.75 mg/dL). Liver Function Tests: Recent Labs  Lab 12/12/21 1808  AST 23  ALT 14  ALKPHOS 95  BILITOT 0.7  PROT 7.3  ALBUMIN 3.8   Recent Labs  Lab 12/12/21 1808  LIPASE 27   No results for input(s): AMMONIA in the last 168 hours. Coagulation Profile: No results for input(s): INR, PROTIME in the last 168 hours. Cardiac Enzymes: No results for input(s): CKTOTAL, CKMB, CKMBINDEX, TROPONINI in the last 168 hours. BNP (last 3 results) No results for input(s): PROBNP in  the last 8760 hours. HbA1C: No results for input(s): HGBA1C in the last 72 hours. CBG: Recent Labs  Lab 12/13/21 0753  GLUCAP 116*   Lipid Profile: No results for input(s): CHOL, HDL, LDLCALC, TRIG, CHOLHDL, LDLDIRECT in the last 72 hours. Thyroid Function Tests: No results for input(s): TSH, T4TOTAL, FREET4, T3FREE, THYROIDAB in the last 72 hours. Anemia Panel: No results for input(s): VITAMINB12, FOLATE, FERRITIN, TIBC, IRON, RETICCTPCT in the last 72 hours. Sepsis Labs: Recent Labs  Lab 12/12/21 2142  PROCALCITON <0.10    Recent Results (from the past 240 hour(s))  Resp Panel by RT-PCR (Flu A&B, Covid) Nasopharyngeal Swab     Status: None   Collection Time: 12/12/21  9:21 PM   Specimen: Nasopharyngeal Swab; Nasopharyngeal(NP) swabs in vial transport medium  Result Value Ref Range Status   SARS Coronavirus 2 by RT PCR NEGATIVE NEGATIVE Final    Comment: (NOTE) SARS-CoV-2 target nucleic acids are NOT DETECTED.  The SARS-CoV-2 RNA is generally detectable in upper respiratory specimens during the acute phase of infection. The lowest concentration of SARS-CoV-2 viral copies this assay can detect is 138 copies/mL. A negative result does not preclude SARS-Cov-2 infection and should not be used as the sole basis for treatment or other patient management decisions. A negative result may occur with  improper specimen collection/handling, submission of specimen other than nasopharyngeal swab, presence of viral mutation(s) within the areas targeted by this assay, and inadequate number of viral copies(<138 copies/mL). A negative result must be combined with clinical observations, patient history, and epidemiological information. The expected result is Negative.  Fact Sheet for Patients:  EntrepreneurPulse.com.au  Fact Sheet for Healthcare Providers:  IncredibleEmployment.be  This test is no t yet approved or cleared by the [place] FDA  and  has been authorized for detection and/or diagnosis of SARS-CoV-2 by FDA under an Emergency Use Authorization (EUA). This EUA will remain  in effect (meaning this test can be used) for the duration of the COVID-19 declaration under Section 564(b)(1) of the Act, 21 U.S.C.section 360bbb-3(b)(1), unless the authorization is terminated  or revoked sooner.       Influenza A by PCR NEGATIVE NEGATIVE Final   Influenza B by PCR NEGATIVE NEGATIVE Final    Comment: (NOTE) The Xpert Xpress SARS-CoV-2/FLU/RSV plus assay is intended as an aid in the diagnosis of influenza from Nasopharyngeal swab specimens and should not be used as a sole basis for treatment. Nasal washings and aspirates are unacceptable for Xpert Xpress SARS-CoV-2/FLU/RSV testing.  Fact Sheet for Patients: EntrepreneurPulse.com.au  Fact Sheet for Healthcare Providers: IncredibleEmployment.be  This test is not yet approved or cleared by the [place] FDA and has been authorized for detection and/or diagnosis of SARS-CoV-2 by FDA under an Emergency Use Authorization (EUA). This EUA will remain in effect (meaning this test can be used) for the duration of the COVID-19 declaration under Section 564(b)(1) of the Act, 21 U.S.C. section 360bbb-3(b)(1), unless the authorization is terminated or revoked.  Performed at [facility], [address] [place], [place] [address]          Radiology  Studies: CT ABDOMEN PELVIS WO CONTRAST  Result Date: 12/12/2021 CLINICAL DATA:  Abdominal pain. EXAM: CT ABDOMEN AND PELVIS WITHOUT CONTRAST TECHNIQUE: Multidetector CT imaging of the abdomen and pelvis was performed following the standard protocol without IV contrast. RADIATION DOSE REDUCTION: This exam was performed according to the departmental dose-optimization program which includes automated exposure control, adjustment of the mA and/or kV according to patient size and/or use of  iterative reconstruction technique. COMPARISON:  None. FINDINGS: Evaluation of this exam is limited in the absence of intravenous contrast. Lower chest: The visualized lung bases are clear. No intra-abdominal free air or free fluid. Hepatobiliary: Multiple hepatic hypodense lesions most consistent with metastatic disease. Correlation with known malignancy recommended. These measure up to 4.7 x 4.8 cm in the right lobe of the liver. No intrahepatic biliary ductal dilatation. The gallbladder is unremarkable. Pancreas: Unremarkable. No pancreatic ductal dilatation or surrounding inflammatory changes. Spleen: Normal in size without focal abnormality. Adrenals/Urinary Tract: The adrenal glands unremarkable. The kidneys, visualized ureters, and urinary bladder appear unremarkable. Stomach/Bowel: There is focal area of circumferential thickening involving the mid descending colon extending approximately 4.5 cm in length most concerning for malignancy. There is moderate stool throughout the colon. There is haziness of the pericolonic fat centered at the splenic flexure. Clinical correlation is recommended to evaluate for possibility of stercoral colitis. There is no bowel obstruction. The appendix is normal. Vascular/Lymphatic: Mild aortoiliac atherosclerotic disease. The IVC is unremarkable. No portal venous gas. Pericolonic adenopathy with largest lymph node measures approximately 2.2 cm in short axis medial to the mid descending colon. Reproductive: The uterus is anteverted and grossly unremarkable. Small left ovarian follicle. Other: None Musculoskeletal: No acute or significant osseous findings. IMPRESSION: 1. Findings most consistent with colonic malignancy involving the mid descending colon with regional nodal and hepatic metastatic disease. 2. Diffuse pericolonic haziness centered at the splenic flexure may be reactive. Clinical correlation is recommended to exclude colitis. 3. Aortic Atherosclerosis (ICD10-I70.0).  Electronically Signed   By: Anner Crete M.D.   On: 12/12/2021 20:56   CT Chest Wo Contrast  Result Date: 12/12/2021 CLINICAL DATA:  Colon cancer, metastatic, staging. Colonic mass on CT earlier today. EXAM: CT CHEST WITHOUT CONTRAST TECHNIQUE: Multidetector CT imaging of the chest was performed following the standard protocol without IV contrast. RADIATION DOSE REDUCTION: This exam was performed according to the departmental dose-optimization program which includes automated exposure control, adjustment of the mA and/or kV according to patient size and/or use of iterative reconstruction technique. COMPARISON:  No prior chest exams. FINDINGS: Cardiovascular: Normal caliber thoracic aorta. The heart is normal in size. There is no pericardial effusion. Mediastinum/Nodes: Small mediastinal lymph nodes measure up to 8 mm in the prevascular space, series 2, image 46. Non-specific 8 mm epicardial node series 2, image 88. Limited assessment for hilar adenopathy on this unenhanced exam. No esophageal wall thickening. No visualized thyroid nodule. Lungs/Pleura: Scattered tiny pulmonary nodules both lungs, largest 4 mm in the right middle lobe, randomly distributed. The nodules are millimetric and nonspecific, however pulmonary metastasis are suspected in the setting. For example nodules are seen in the right upper lobe series 3, images 36, 40, and 64, right middle lobe series 3, images 64 and 76, left lower lobe series 3, image 72, and right lower lobe series 3, image 105. no confluent consolidation or pleural effusion. The trachea and central bronchi are patent. Upper Abdomen: Assessed on con abdominal CT earlier today, multiple liver masses. Current Musculoskeletal: Tiny sclerotic focus within T7, nonspecific  but likely a bone island, too small to accurately characterize no destructive bone lesions. No chest wall soft tissue abnormalities. IMPRESSION: 1. Scattered tiny pulmonary nodules, largest 4 mm in the right  middle lobe, nonspecific, however pulmonary metastasis are suspected in the setting of known malignancy. 2. Small mediastinal lymph nodes are not enlarged by size criteria, largest 8 mm, nonspecific. 3. Tiny sclerotic focus within T7, too small to accurately characterize. Electronically Signed   By: Keith Rake M.D.   On: 12/12/2021 22:30        Scheduled Meds:  insulin aspart  0-9 Units Subcutaneous TID AC & HS   senna-docusate  2 tablet Oral BID   Continuous Infusions:  sodium chloride 100 mL/hr at 12/12/21 2306     LOS: 1 day    Time spent: 27 minutes    Sharen Hones, MD [facility]   To contact the attending provider between 7A-7P or the covering provider during after hours 7P-7A, please log into the web site [email or web address] and access using universal [place] password for that web site. If you do not have the password, please call the hospital operator.  12/13/2021, 10:05 AM

## 2021-12-13 NOTE — Consult Note (Signed)
Hematology/Oncology Consult note [facility] [phone] [phone]  Patient Care Team: Pcp, No as PCP - General   Name of the patient: Jennifer Ramirez  [id number]  1976/02/24    Reason for consult: Concern for metastatic colon cancer   Requesting physician: Dr. Hulan Saas  Date of visit: 12/13/2021    History of presenting illness- Patient is a 46 year old female with a past medical history significant for bipolar disorder PTSD asthma and diabetes who presented with symptoms of worsening abdominal pain as well as constipation over the last 1 month.  She has tried taking over-the-counter stool softeners and laxatives which did not relieve her pain.  Patient had a CT abdomen pelvis without contrast done in the ER which showed multiple hepatic hypodense lesions consistent with metastatic disease.  These measure up to 4.7 x 4.8 cm in the right lobe of the liver.  Focal area of circumferential thickening involving the mid descending colon extending approximately 4.5 cm in length most concerning for malignancy.  Moderate stool throughout the colon.  Haziness of the pericolonic fat centered at the splenic flexure.  Pericolonic adenopathy with largest lymph node measuring 2.2 cm.  No evidence of bowel obstruction.  Oncology consulted for further management  Patient states that she lives with her husband in [place] and has only been [place] for the last 1 month.  She was here to help out with her brother-in-law who was diagnosed with seizure disorder to help him with his medical appointments.  She is considering moving back to [place] given her new diagnosis of metastatic colon cancer.  She reports that her bowel medications are presently working in the hospital and she has been having bowel movements for the last few hours.  ECOG PS- 1  Pain scale- 3   Review of systems- Review of Systems  Constitutional:  Positive for malaise/fatigue.  Negative for chills, fever and weight loss.  HENT:  Negative for congestion, ear discharge and nosebleeds.   Eyes:  Negative for blurred vision.  Respiratory:  Negative for cough, hemoptysis, sputum production, shortness of breath and wheezing.   Cardiovascular:  Negative for chest pain, palpitations, orthopnea and claudication.  Gastrointestinal:  Positive for constipation and nausea. Negative for abdominal pain, blood in stool, diarrhea, heartburn, melena and vomiting.  Genitourinary:  Negative for dysuria, flank pain, frequency, hematuria and urgency.  Musculoskeletal:  Negative for back pain, joint pain and myalgias.  Skin:  Negative for rash.  Neurological:  Negative for dizziness, tingling, focal weakness, seizures, weakness and headaches.  Endo/Heme/Allergies:  Does not bruise/bleed easily.  Psychiatric/Behavioral:  Negative for depression and suicidal ideas. The patient does not have insomnia.    Allergies  Allergen Reactions   Amoxicillin Anaphylaxis   Ivp Dye [Iodinated Contrast Media] Anaphylaxis    Pt states her "airway swells shut"   Keflex [Cephalexin] Nausea And Vomiting   Lidocaine Hives    Lidocaine and novacaine.   Sulfa Antibiotics Other (See Comments)    Stroke like symptoms    Patient Active Problem List   Diagnosis Date Noted   Metastatic colon cancer to liver ([place]) 12/12/2021   Abdominal pain 12/12/2021     Past Medical History:  Diagnosis Date   Asthma    Bipolar disease, manic ([place])    Fibromyalgia      Past Surgical History:  Procedure Laterality Date   CESAREAN SECTION      Social History   Socioeconomic History   Marital status: Married  Spouse name: Not on file   Number of children: Not on file   Years of education: Not on file   Highest education level: Not on file  Occupational History   Not on file  Tobacco Use   Smoking status: Every Day    Types: Cigarettes   Smokeless tobacco: Never  Substance and Sexual Activity    Alcohol use: Yes   Drug use: Not on file    Comment: occasional   Sexual activity: Not on file  Other Topics Concern   Not on file  Social History Narrative   Not on file   Social Determinants of Health   Financial Resource Strain: Not on file  Food Insecurity: Not on file  Transportation Needs: Not on file  Physical Activity: Not on file  Stress: Not on file  Social Connections: Not on file  Intimate Partner Violence: Not on file     History reviewed. No pertinent family history.   Current Facility-Administered Medications:    0.9 %  sodium chloride infusion, , Intravenous, Continuous, Sharen Hones, MD, Last Rate: 50 mL/hr at 12/13/21 1027, Rate Change at 12/13/21 1027   acetaminophen (TYLENOL) tablet 650 mg, 650 mg, Oral, Q6H PRN **OR** acetaminophen (TYLENOL) suppository 650 mg, 650 mg, Rectal, Q6H PRN, Mansy, Jan A, MD   insulin aspart (novoLOG) injection 0-9 Units, 0-9 Units, Subcutaneous, TID AC & HS, Mansy, Jan A, MD   magnesium hydroxide (MILK OF MAGNESIA) suspension 30 mL, 30 mL, Oral, Daily PRN, Mansy, Jan A, MD   morphine 2 MG/ML injection 2 mg, 2 mg, Intravenous, Q4H PRN, Mansy, Jan A, MD, 2 mg at 12/13/21 0940   ondansetron (ZOFRAN) tablet 4 mg, 4 mg, Oral, Q6H PRN **OR** ondansetron (ZOFRAN) injection 4 mg, 4 mg, Intravenous, Q6H PRN, Mansy, Arvella Merles, MD   senna-docusate (Senokot-S) tablet 2 tablet, 2 tablet, Oral, BID, Sharen Hones, MD, 2 tablet at 12/13/21 0939   traZODone (DESYREL) tablet 25 mg, 25 mg, Oral, QHS PRN, Mansy, Arvella Merles, MD   Physical exam:  Vitals:   12/12/21 2227 12/13/21 0349 12/13/21 0800 12/13/21 0840  BP: 114/74 92/64 (!) 96/54 (!) 101/55  Pulse: 75 69 71   Resp: 18 16 16    Temp: 98 F (36.7 C) 98.4 F (36.9 C) 98.3 F (36.8 C)   TempSrc:   Oral   SpO2: 98% 96% 95%   Weight:      Height:       Physical Exam Constitutional:      General: She is not in acute distress. Cardiovascular:     Rate and Rhythm: Normal rate and regular rhythm.      Heart sounds: Normal heart sounds.  Pulmonary:     Effort: Pulmonary effort is normal.     Breath sounds: Normal breath sounds.  Abdominal:     General: Bowel sounds are normal. There is no distension.     Palpations: Abdomen is soft.     Tenderness: There is no abdominal tenderness.  Musculoskeletal:     Cervical back: Normal range of motion.  Skin:    General: Skin is warm and dry.  Neurological:     Mental Status: She is alert and oriented to person, place, and time.       CMP Latest Ref Rng & Units 12/13/2021  Glucose 70 - 99 mg/dL 114(H)  BUN 6 - 20 mg/dL 12  Creatinine 0.44 - 1.00 mg/dL 0.75  Sodium 135 - 145 mmol/L 135  Potassium 3.5 - 5.1  mmol/L 3.8  Chloride 98 - 111 mmol/L 102  CO2 22 - 32 mmol/L 26  Calcium 8.9 - 10.3 mg/dL 8.6(L)  Total Protein 6.5 - 8.1 g/dL -  Total Bilirubin 0.3 - 1.2 mg/dL -  Alkaline Phos 38 - 126 U/L -  AST 15 - 41 U/L -  ALT 0 - 44 U/L -   CBC Latest Ref Rng & Units 12/13/2021  WBC 4.0 - 10.5 K/uL 9.7  Hemoglobin 12.0 - 15.0 g/dL 12.2  Hematocrit 36.0 - 46.0 % 37.1  Platelets 150 - 400 K/uL 272    @IMAGES @  CT ABDOMEN PELVIS WO CONTRAST  Result Date: 12/12/2021 CLINICAL DATA:  Abdominal pain. EXAM: CT ABDOMEN AND PELVIS WITHOUT CONTRAST TECHNIQUE: Multidetector CT imaging of the abdomen and pelvis was performed following the standard protocol without IV contrast. RADIATION DOSE REDUCTION: This exam was performed according to the departmental dose-optimization program which includes automated exposure control, adjustment of the mA and/or kV according to patient size and/or use of iterative reconstruction technique. COMPARISON:  None. FINDINGS: Evaluation of this exam is limited in the absence of intravenous contrast. Lower chest: The visualized lung bases are clear. No intra-abdominal free air or free fluid. Hepatobiliary: Multiple hepatic hypodense lesions most consistent with metastatic disease. Correlation with known malignancy  recommended. These measure up to 4.7 x 4.8 cm in the right lobe of the liver. No intrahepatic biliary ductal dilatation. The gallbladder is unremarkable. Pancreas: Unremarkable. No pancreatic ductal dilatation or surrounding inflammatory changes. Spleen: Normal in size without focal abnormality. Adrenals/Urinary Tract: The adrenal glands unremarkable. The kidneys, visualized ureters, and urinary bladder appear unremarkable. Stomach/Bowel: There is focal area of circumferential thickening involving the mid descending colon extending approximately 4.5 cm in length most concerning for malignancy. There is moderate stool throughout the colon. There is haziness of the pericolonic fat centered at the splenic flexure. Clinical correlation is recommended to evaluate for possibility of stercoral colitis. There is no bowel obstruction. The appendix is normal. Vascular/Lymphatic: Mild aortoiliac atherosclerotic disease. The IVC is unremarkable. No portal venous gas. Pericolonic adenopathy with largest lymph node measures approximately 2.2 cm in short axis medial to the mid descending colon. Reproductive: The uterus is anteverted and grossly unremarkable. Small left ovarian follicle. Other: None Musculoskeletal: No acute or significant osseous findings. IMPRESSION: 1. Findings most consistent with colonic malignancy involving the mid descending colon with regional nodal and hepatic metastatic disease. 2. Diffuse pericolonic haziness centered at the splenic flexure may be reactive. Clinical correlation is recommended to exclude colitis. 3. Aortic Atherosclerosis (ICD10-I70.0). Electronically Signed   By: Anner Crete M.D.   On: 12/12/2021 20:56   CT Chest Wo Contrast  Result Date: 12/12/2021 CLINICAL DATA:  Colon cancer, metastatic, staging. Colonic mass on CT earlier today. EXAM: CT CHEST WITHOUT CONTRAST TECHNIQUE: Multidetector CT imaging of the chest was performed following the standard protocol without IV contrast.  RADIATION DOSE REDUCTION: This exam was performed according to the departmental dose-optimization program which includes automated exposure control, adjustment of the mA and/or kV according to patient size and/or use of iterative reconstruction technique. COMPARISON:  No prior chest exams. FINDINGS: Cardiovascular: Normal caliber thoracic aorta. The heart is normal in size. There is no pericardial effusion. Mediastinum/Nodes: Small mediastinal lymph nodes measure up to 8 mm in the prevascular space, series 2, image 46. Non-specific 8 mm epicardial node series 2, image 88. Limited assessment for hilar adenopathy on this unenhanced exam. No esophageal wall thickening. No visualized thyroid nodule. Lungs/Pleura: Scattered tiny  pulmonary nodules both lungs, largest 4 mm in the right middle lobe, randomly distributed. The nodules are millimetric and nonspecific, however pulmonary metastasis are suspected in the setting. For example nodules are seen in the right upper lobe series 3, images 36, 40, and 64, right middle lobe series 3, images 64 and 76, left lower lobe series 3, image 72, and right lower lobe series 3, image 105. no confluent consolidation or pleural effusion. The trachea and central bronchi are patent. Upper Abdomen: Assessed on con abdominal CT earlier today, multiple liver masses. Current Musculoskeletal: Tiny sclerotic focus within T7, nonspecific but likely a bone island, too small to accurately characterize no destructive bone lesions. No chest wall soft tissue abnormalities. IMPRESSION: 1. Scattered tiny pulmonary nodules, largest 4 mm in the right middle lobe, nonspecific, however pulmonary metastasis are suspected in the setting of known malignancy. 2. Small mediastinal lymph nodes are not enlarged by size criteria, largest 8 mm, nonspecific. 3. Tiny sclerotic focus within T7, too small to accurately characterize. Electronically Signed   By: Keith Rake M.D.   On: 12/12/2021 22:30     Assessment and plan- Patient is a 46 y.o. female admitted for abdominal pain and constipation with imaging findings concerning for metastatic colon cancer  I have reviewed CT abdomen pelvis images independently and discussed findings with the patient.  I had also asked ER to do a CT chest without contrast which showsSubcentimeter bilateral pulmonary nodules measuring up to 4 mm which are nonspecific but concerning for malignancy.  Clinically patient is able to pass gas and move her bowels.  Radiologically there was no evidence of bowel obstruction.  I therefore do not see an indication for palliative surgery at this time.  Given that she has evidence of liver metastases this is stage IV disease unfortunately and therefore colonoscopy resection of primary colon mass would not help unless it is causing symptomatic obstruction.  Clinically patient seems to be improving and has been having regular bowel movements for the last few hours.  CEA has been ordered and is currently pending.  Plan is for ultrasound-guided liver biopsy tomorrow.  Discussed with the patient that stage IV colon cancer is treatable but not curable and treatment would involve systemic chemotherapy given with a palliative intent.  Constipation: Recommend aggressive bowel regimen and try to minimize the use of opioids which can worsen constipation patient is currently on milk of magnesia and lactulose.  Try to use Compazine for nausea instead of Zofran as Zofran can be potentially constipating.    I will see patient next week after her biopsy results are back to discuss further management   Visit Diagnosis 1. Abnormal CT scan   2. Metastatic colon cancer in female [facility])   3. Liver metastases ([place])     Dr. Randa Evens, MD, MPH [facility] [phone] 12/13/2021

## 2021-12-14 ENCOUNTER — Other Ambulatory Visit: Payer: Self-pay

## 2021-12-14 ENCOUNTER — Inpatient Hospital Stay: Payer: Medicare Other

## 2021-12-14 DIAGNOSIS — C189 Malignant neoplasm of colon, unspecified: Secondary | ICD-10-CM | POA: Diagnosis not present

## 2021-12-14 DIAGNOSIS — C787 Secondary malignant neoplasm of liver and intrahepatic bile duct: Secondary | ICD-10-CM | POA: Diagnosis not present

## 2021-12-14 LAB — CBC
HCT: 36.5 % (ref 36.0–46.0)
Hemoglobin: 12.1 g/dL (ref 12.0–15.0)
MCH: 27.6 pg (ref 26.0–34.0)
MCHC: 33.2 g/dL (ref 30.0–36.0)
MCV: 83.1 fL (ref 80.0–100.0)
Platelets: 242 10*3/uL (ref 150–400)
RBC: 4.39 MIL/uL (ref 3.87–5.11)
RDW: 12.8 % (ref 11.5–15.5)
WBC: 7.4 10*3/uL (ref 4.0–10.5)
nRBC: 0 % (ref 0.0–0.2)

## 2021-12-14 LAB — URINE CULTURE: Culture: >=100000 — AB

## 2021-12-14 LAB — GLUCOSE, CAPILLARY
Glucose-Capillary: 130 mg/dL — ABNORMAL HIGH (ref 70–99)
Glucose-Capillary: 113 mg/dL — ABNORMAL HIGH (ref 70–99)
Glucose-Capillary: 158 mg/dL — ABNORMAL HIGH (ref 70–99)

## 2021-12-14 LAB — BASIC METABOLIC PANEL
Anion gap: 8 (ref 5–15)
BUN: 16 mg/dL (ref 6–20)
CO2: 24 mmol/L (ref 22–32)
Calcium: 8.3 mg/dL — ABNORMAL LOW (ref 8.9–10.3)
Chloride: 103 mmol/L (ref 98–111)
Creatinine, Ser: 0.68 mg/dL (ref 0.44–1.00)
GFR, Estimated: >60 mL/min (ref >60)
Glucose, Bld: 123 mg/dL — ABNORMAL HIGH (ref 70–99)
Potassium: 3.7 mmol/L (ref 3.5–5.1)
Sodium: 135 mmol/L (ref 135–145)

## 2021-12-14 LAB — PROTIME-INR
INR: 1 (ref 0.8–1.2)
Prothrombin Time: 13 seconds (ref 11.4–15.2)

## 2021-12-14 LAB — MAGNESIUM: Magnesium: 1.9 mg/dL (ref 1.7–2.4)

## 2021-12-14 LAB — CEA: CEA: 577 ng/mL — ABNORMAL HIGH (ref 0.0–4.7)

## 2021-12-14 MED ORDER — MIDAZOLAM HCL 5 MG/5ML IJ SOLN
INTRAMUSCULAR | Status: AC | PRN
  Administered 2021-12-14: 1 mg via INTRAVENOUS

## 2021-12-14 MED ORDER — MIDAZOLAM HCL 2 MG/2ML IJ SOLN
INTRAMUSCULAR | Status: AC | PRN
Start: 2021-12-14 — End: 2021-12-14
  Administered 2021-12-14: 1 mg via INTRAVENOUS

## 2021-12-14 MED ORDER — FENTANYL CITRATE (PF) 100 MCG/2ML IJ SOLN
INTRAMUSCULAR | Status: AC | PRN
  Administered 2021-12-14 (×2): 50 ug via INTRAVENOUS

## 2021-12-14 MED ORDER — SENNOSIDES-DOCUSATE SODIUM 8.6-50 MG PO TABS
2.0000 | ORAL_TABLET | Freq: Two times a day (BID) | ORAL | 0 refills | Status: AC
Start: 2021-12-14 — End: ?
  Filled 2021-12-14: qty 60, 15d supply, fill #0

## 2021-12-14 MED ORDER — MIDAZOLAM HCL 2 MG/2ML IJ SOLN
INTRAMUSCULAR | Status: AC
  Filled 2021-12-14: qty 2

## 2021-12-14 MED ORDER — FENTANYL CITRATE (PF) 100 MCG/2ML IJ SOLN
INTRAMUSCULAR | Status: AC
  Filled 2021-12-14: qty 2

## 2021-12-14 MED ORDER — TETRACAINE HCL 1 % IJ SOLN
20.0000 mg | Freq: Once | INTRAMUSCULAR | Status: DC
  Filled 2021-12-14: qty 2

## 2021-12-14 MED ORDER — OXYCODONE-ACETAMINOPHEN 5-325 MG PO TABS: 1.0000 | ORAL_TABLET | ORAL | 0 refills | Status: AC | PRN

## 2021-12-14 NOTE — Progress Notes (Signed)
DISCHARGE NOTE:  Patient has been read and given her discharge packet. Patient wheeled to car by staff. Patient transferred home by spouse. All belongings sent with patient.

## 2021-12-14 NOTE — Procedures (Signed)
Interventional Radiology Procedure Note  Procedure: US Guided Biopsy of liver  Complications: None  Estimated Blood Loss: < 10 mL  Findings: 18 G core biopsy of right lobe liver lesion performed under US guidance.  Three core samples obtained and sent to Pathology.  Elsia Lasota T. Tameca Jerez, M.D Pager:  [phone]    

## 2021-12-14 NOTE — Progress Notes (Signed)
Patient clinically stable post Liver biopsy per Dr Kathlene Cote, tolerated well. Denies complaints at present. Vitals stable pre and post procedure. Received Versed 2 mg along with Fentanyl 100 mcg IV for procedure.report given at bedside to [facility] with questions answered.

## 2021-12-14 NOTE — Discharge Summary (Signed)
Physician Discharge Summary  Patient ID: Jennifer Ramirez MRN: [id number] DOB/AGE: 48-Sep-1977 48 y.o.  Admit date: 12/12/2021 Discharge date: 12/14/2021  Admission Diagnoses:  Discharge Diagnoses:  Principal Problem:   Metastatic colon cancer to liver [facility]) Active Problems:   Abdominal pain   Discharged Condition: good  Hospital Course:  Jennifer Ramirez is a 46 y.o. female with medical history significant for asthma, type II diabetes mellitus, ongoing tobacco abuse, bipolar disorder and fibromyalgia who presented to the ER with acute onset of generalized abdominal pain with associated nausea and vomiting. CT chest/abdomen/pelvis showed colonic malignancy with diffuse metastasis to liver and lungs. Oncology consult is obtained, scheduled for IR biopsy.   Abdominal pain. Colon cancer with metastasis to liver and lungs. Constipation. Appeared due to metastatic cancer, constipation may also played a role. Patiently is evaluated by oncology, liver biopsy is performed today.  Patient will be discharged after the procedure, follow-up with oncology in 1 week for biopsy results.   Type 2 diabetes. Glucose not significantly elevated.  Morbid obesity. Dyslipidemia   Tobacco abuse. Advised to quit.    Consults: hematology/oncology  Significant Diagnostic Studies:   Treatments: Liver biopsy  Discharge Exam: Blood pressure 112/63, pulse 65, temperature 97.6 F (36.4 C), resp. rate 18, height 5\' 6"  (1.676 m), weight 112.9 kg, last menstrual period 12/06/2021, SpO2 98 %. General appearance: alert and cooperative Resp: clear to auscultation bilaterally Cardio: regular rate and rhythm, S1, S2 normal, no murmur, click, rub or gallop GI: soft, non-tender; bowel sounds normal; no masses,  no organomegaly Extremities: extremities normal, atraumatic, no cyanosis or edema  Disposition:    Allergies as of 12/14/2021       Reactions   Amoxicillin Anaphylaxis   Ivp Dye [iodinated  Contrast Media] Anaphylaxis   Pt states her "airway swells shut"   Keflex [cephalexin] Nausea And Vomiting   Lidocaine Hives   Lidocaine and novacaine.   Sulfa Antibiotics Other (See Comments)   Stroke like symptoms        Medication List     TAKE these medications    oxyCODONE-acetaminophen 5-325 MG tablet Commonly known as: Percocet Take 1 tablet by mouth every 4 (four) hours as needed for severe pain.   senna-docusate 8.6-50 MG tablet Commonly known as: Senokot-S Take 2 tablets by mouth 2 (two) times daily.   traZODone 150 MG tablet Commonly known as: DESYREL Take 150 mg by mouth at bedtime.        Follow-up Information     Sindy Guadeloupe, MD Follow up in 1 week(s).   Specialty: Oncology Contact information: [place] [address] [phone]                 Signed: Sharen Hones 12/14/2021, 1:43 PM

## 2021-12-14 NOTE — TOC Initial Note (Signed)
Transition of Care [facility]) - Initial/Assessment Note    Patient Details  Name: Jennifer Ramirez MRN: [id number] Date of Birth: 1976/01/19  Transition of Care [facility]) CM/SW Contact:    Candie Chroman, LCSW Phone Number: 12/14/2021, 10:14 AM  Clinical Narrative:   CSW met with patient. Husband at bedside. CSW introduced role and inquired about patient not having a PCP. They just moved here from [place] at the beginning of January. Due to recent medical findings, plan is to move to [place] next Friday to be with her family. Patient does not have insurance. Will follow for medication assistance needs at discharge. No further concerns. CSW encouraged patient and her husband to contact CSW as needed. CSW will continue to follow patient and her husband for support and facilitate discharge once medically stable.               Expected Discharge Plan: Home/Self Care Barriers to Discharge: Continued Medical Work up   Patient Goals and CMS Choice        Expected Discharge Plan and Services Expected Discharge Plan: Home/Self Care     Post Acute Care Choice: NA Living arrangements for the past 2 months: Single Family Home                                      Prior Living Arrangements/Services Living arrangements for the past 2 months: Single Family Home Lives with:: Spouse Patient language and need for interpreter reviewed:: Yes Do you feel safe going back to the place where you live?: Yes      Need for Family Participation in Patient Care: Yes (Comment) Care giver support system in place?: Yes (comment)   Criminal Activity/Legal Involvement Pertinent to Current Situation/Hospitalization: No - Comment as needed  Activities of Daily Living Home Assistive Devices/Equipment: Other (Comment) ADL Screening (condition at time of admission) Is the patient deaf or have difficulty hearing?: No Does the patient have difficulty seeing, even when wearing glasses/contacts?: No Does the  patient have difficulty concentrating, remembering, or making decisions?: No Does the patient have difficulty dressing or bathing?: No Does the patient have difficulty walking or climbing stairs?: No  Permission Sought/Granted Permission sought to share information with : Family Supports    Share Information with NAME: Ronalee Belts Brummitt     Permission granted to share info w Relationship: Husband  Permission granted to share info w Contact Information: [phone]  Emotional Assessment Appearance:: Appears stated age Attitude/Demeanor/Rapport: Engaged, Gracious Affect (typically observed): Adaptable, Appropriate, Calm, Pleasant Orientation: : Oriented to Self, Oriented to Place, Oriented to  Time, Oriented to Situation Alcohol / Substance Use: Not Applicable Psych Involvement: No (comment)  Admission diagnosis:  Liver metastases ([facility]) [C78.7] Abnormal CT scan [R93.89] Metastatic colon cancer to liver ([facility]) [C18.9, C78.7] Abdominal pain [R10.9] Metastatic colon cancer in female [facility]) [C18.9] Patient Active Problem List   Diagnosis Date Noted   Metastatic colon cancer to liver ([place]) 12/12/2021   Abdominal pain 12/12/2021   PCP:  Pcp, No Pharmacy:  No Pharmacies Listed    Social Determinants of Health (SDOH) Interventions    Readmission Risk Interventions No flowsheet data found.

## 2021-12-19 ENCOUNTER — Encounter: Payer: Self-pay | Admitting: Oncology

## 2021-12-19 ENCOUNTER — Inpatient Hospital Stay: Payer: Medicare PPO | Attending: Oncology | Admitting: Oncology

## 2021-12-19 ENCOUNTER — Other Ambulatory Visit: Payer: Self-pay

## 2021-12-19 VITALS — BP 109/65 | HR 76 | Temp 97.2°F | Resp 18 | Ht 66.0 in | Wt 253.0 lb

## 2021-12-19 DIAGNOSIS — C189 Malignant neoplasm of colon, unspecified: Secondary | ICD-10-CM | POA: Diagnosis not present

## 2021-12-19 DIAGNOSIS — C186 Malignant neoplasm of descending colon: Secondary | ICD-10-CM | POA: Insufficient documentation

## 2021-12-19 DIAGNOSIS — Z79899 Other long term (current) drug therapy: Secondary | ICD-10-CM | POA: Insufficient documentation

## 2021-12-19 DIAGNOSIS — Z7189 Other specified counseling: Secondary | ICD-10-CM | POA: Diagnosis not present

## 2021-12-19 DIAGNOSIS — J45909 Unspecified asthma, uncomplicated: Secondary | ICD-10-CM | POA: Insufficient documentation

## 2021-12-19 DIAGNOSIS — R918 Other nonspecific abnormal finding of lung field: Secondary | ICD-10-CM | POA: Insufficient documentation

## 2021-12-19 DIAGNOSIS — C787 Secondary malignant neoplasm of liver and intrahepatic bile duct: Secondary | ICD-10-CM | POA: Diagnosis not present

## 2021-12-19 DIAGNOSIS — F17210 Nicotine dependence, cigarettes, uncomplicated: Secondary | ICD-10-CM | POA: Diagnosis not present

## 2021-12-19 DIAGNOSIS — E119 Type 2 diabetes mellitus without complications: Secondary | ICD-10-CM | POA: Diagnosis not present

## 2021-12-19 DIAGNOSIS — F319 Bipolar disorder, unspecified: Secondary | ICD-10-CM | POA: Diagnosis not present

## 2021-12-19 LAB — SURGICAL PATHOLOGY

## 2021-12-19 NOTE — Progress Notes (Signed)
Hematology/Oncology Consult note 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  She has tried taking over-the-counter stool softeners and laxatives which did not relieve her pain.  Patient had a CT abdomen pelvis without contrast done in the ER which showed multiple hepatic hypodense lesions consistent with metastatic disease.  These measure up to 4.7 x 4.8 cm in the right lobe of the liver.  Focal area of circumferential thickening involving the mid descending colon extending approximately 4.5 cm in length most concerning for malignancy.  Moderate stool throughout the colon.  Haziness of the pericolonic fat centered at the splenic flexure.  Pericolonic adenopathy with largest lymph node measuring 2.2 cm.  No evidence of bowel obstruction.  CT chest without contrast showed bilateral subcentimeter lung nodules concerning for metastatic disease.  Oncology consulted for further management   Patient states that she lives with her husband in [place] and has only been [place] for the last 1 month.  She was here to help out with her brother-in-law who was diagnosed with seizure disorder to help him with his medical appointments.  She is considering moving back to [place] given her new diagnosis of metastatic colon cancer.   Ultrasound-guided liver biopsy showed metastatic adenocarcinoma compatible with colon  primary.Immunohistochemistry show tumor cells positive for CK20 and CDX2 supporting above diagnosis.  Interval history-patient is here with her mother today.  She is planning to drive back to [place] today and will reach [place] on Monday.  She is currently taking senna 2 tablets a day and is having regular bowel movements  ECOG PS- 0 Pain scale- 0   Review of systems- Review of Systems  Constitutional:  Positive for malaise/fatigue. Negative for chills, fever and weight loss.  HENT:  Negative for congestion, ear discharge and nosebleeds.   Eyes:  Negative for blurred vision.  Respiratory:  Negative for cough, hemoptysis, sputum production, shortness of breath and wheezing.   Cardiovascular:  Negative for chest pain, palpitations, orthopnea and claudication.  Gastrointestinal:  Negative for abdominal pain, blood in stool, constipation, diarrhea, heartburn, melena, nausea and vomiting.  Genitourinary:  Negative for dysuria, flank pain, frequency, hematuria and urgency.  Musculoskeletal:  Negative for back pain, joint pain and myalgias.  Skin:  Negative for rash.  Neurological:  Negative for dizziness, tingling, focal weakness, seizures, weakness and headaches.  Endo/Heme/Allergies:  Does not bruise/bleed easily.  Psychiatric/Behavioral:  Negative for depression and suicidal ideas. The patient does not have insomnia.      Allergies  Allergen Reactions   Amoxicillin Anaphylaxis   Ivp Dye [Iodinated Contrast Media] Anaphylaxis    Pt states her "airway swells shut"   Keflex [Cephalexin] Nausea And Vomiting   Lidocaine Hives    Lidocaine and novacaine.   Sulfa Antibiotics Other (See Comments)    Stroke like symptoms     Past Medical History:  Diagnosis Date   Anxiety    Asthma    Bipolar disease, manic ([place])  Colon cancer ([place])    Depression    Diabetes type 2, controlled ([place])    Fibromyalgia    Psychosis ([place])      Past Surgical History:  Procedure Laterality Date    CESAREAN SECTION      Social History   Socioeconomic History   Marital status: Married    Spouse name: Not on file   Number of children: Not on file   Years of education: Not on file   Highest education level: Not on file  Occupational History   Not on file  Tobacco Use   Smoking status: Every Day    Types: Cigarettes   Smokeless tobacco: Never  Substance and Sexual Activity   Alcohol use: Yes   Drug use: Not on file    Comment: occasional   Sexual activity: Not on file  Other Topics Concern   Not on file  Social History Narrative   Not on file   Social Determinants of Health   Financial Resource Strain: Not on file  Food Insecurity: Not on file  Transportation Needs: Not on file  Physical Activity: Not on file  Stress: Not on file  Social Connections: Not on file  Intimate Partner Violence: Not on file    Family History  Problem Relation Age of Onset   Cancer Mother    Cancer Maternal Grandmother      Current Outpatient Medications:    oxyCODONE-acetaminophen (PERCOCET) 5-325 MG tablet, Take 1 tablet by mouth every 4 (four) hours as needed for severe pain., Disp: 16 tablet, Rfl: 0   senna-docusate (SENOKOT-S) 8.6-50 MG tablet, Take 2 tablets by mouth 2 (two) times daily., Disp: 60 tablet, Rfl: 0   traZODone (DESYREL) 150 MG tablet, Take 150 mg by mouth at bedtime., Disp: , Rfl:   Physical exam:  Vitals:   12/19/21 1405  BP: 109/65  Pulse: 76  Resp: 18  Temp: (!) 97.2 F (36.2 C)  TempSrc: Tympanic  SpO2: 98%  Weight: 253 lb (114.8 kg)  Height: 5' 6"  (1.676 m)   Physical Exam Constitutional:      General: She is not in acute distress. Cardiovascular:     Rate and Rhythm: Normal rate and regular rhythm.     Heart sounds: Normal heart sounds.  Pulmonary:     Effort: Pulmonary effort is normal.     Breath sounds: Normal breath sounds.  Abdominal:     General: Bowel sounds are normal.     Palpations: Abdomen is soft.  Skin:    General: Skin  is warm and dry.  Neurological:     Mental Status: She is alert and oriented to person, place, and time.     CMP Latest Ref Rng & Units 12/14/2021  Glucose 70 - 99 mg/dL 123(H)  BUN 6 - 20 mg/dL 16  Creatinine 0.44 - 1.00 mg/dL 0.68  Sodium 135 - 145 mmol/L 135  Potassium 3.5 - 5.1 mmol/L 3.7  Chloride 98 - 111 mmol/L 103  CO2 22 - 32 mmol/L 24  Calcium 8.9 - 10.3 mg/dL 8.3(L)  Total Protein 6.5 - 8.1 g/dL -  Total Bilirubin 0.3 - 1.2 mg/dL -  Alkaline Phos 38 - 126 U/L -  AST 15 - 41 U/L -  ALT 0 - 44 U/L -   CBC Latest Ref Rng & Units 12/14/2021  WBC 4.0 - 10.5 K/uL 7.4  Hemoglobin 12.0 - 15.0 g/dL 12.1  Hematocrit 36.0 - 46.0 % 36.5  Platelets 150 - 400 K/uL  242    No images are attached to the encounter.  CT ABDOMEN PELVIS WO CONTRAST  Result Date: 12/12/2021 CLINICAL DATA:  Abdominal pain. EXAM: CT ABDOMEN AND PELVIS WITHOUT CONTRAST TECHNIQUE: Multidetector CT imaging of the abdomen and pelvis was performed following the standard protocol without IV contrast. RADIATION DOSE REDUCTION: This exam was performed according to the departmental dose-optimization program which includes automated exposure control, adjustment of the mA and/or kV according to patient size and/or use of iterative reconstruction technique. COMPARISON:  None. FINDINGS: Evaluation of this exam is limited in the absence of intravenous contrast. Lower chest: The visualized lung bases are clear. No intra-abdominal free air or free fluid. Hepatobiliary: Multiple hepatic hypodense lesions most consistent with metastatic disease. Correlation with known malignancy recommended. These measure up to 4.7 x 4.8 cm in the right lobe of the liver. No intrahepatic biliary ductal dilatation. The gallbladder is unremarkable. Pancreas: Unremarkable. No pancreatic ductal dilatation or surrounding inflammatory changes. Spleen: Normal in size without focal abnormality. Adrenals/Urinary Tract: The adrenal glands unremarkable. The  kidneys, visualized ureters, and urinary bladder appear unremarkable. Stomach/Bowel: There is focal area of circumferential thickening involving the mid descending colon extending approximately 4.5 cm in length most concerning for malignancy. There is moderate stool throughout the colon. There is haziness of the pericolonic fat centered at the splenic flexure. Clinical correlation is recommended to evaluate for possibility of stercoral colitis. There is no bowel obstruction. The appendix is normal. Vascular/Lymphatic: Mild aortoiliac atherosclerotic disease. The IVC is unremarkable. No portal venous gas. Pericolonic adenopathy with largest lymph node measures approximately 2.2 cm in short axis medial to the mid descending colon. Reproductive: The uterus is anteverted and grossly unremarkable. Small left ovarian follicle. Other: None Musculoskeletal: No acute or significant osseous findings. IMPRESSION: 1. Findings most consistent with colonic malignancy involving the mid descending colon with regional nodal and hepatic metastatic disease. 2. Diffuse pericolonic haziness centered at the splenic flexure may be reactive. Clinical correlation is recommended to exclude colitis. 3. Aortic Atherosclerosis (ICD10-I70.0). Electronically Signed   By: Anner Crete M.D.   On: 12/12/2021 20:56   CT Chest Wo Contrast  Result Date: 12/12/2021 CLINICAL DATA:  Colon cancer, metastatic, staging. Colonic mass on CT earlier today. EXAM: CT CHEST WITHOUT CONTRAST TECHNIQUE: Multidetector CT imaging of the chest was performed following the standard protocol without IV contrast. RADIATION DOSE REDUCTION: This exam was performed according to the departmental dose-optimization program which includes automated exposure control, adjustment of the mA and/or kV according to patient size and/or use of iterative reconstruction technique. COMPARISON:  No prior chest exams. FINDINGS: Cardiovascular: Normal caliber thoracic aorta. The heart  is normal in size. There is no pericardial effusion. Mediastinum/Nodes: Small mediastinal lymph nodes measure up to 8 mm in the prevascular space, series 2, image 46. Non-specific 8 mm epicardial node series 2, image 88. Limited assessment for hilar adenopathy on this unenhanced exam. No esophageal wall thickening. No visualized thyroid nodule. Lungs/Pleura: Scattered tiny pulmonary nodules both lungs, largest 4 mm in the right middle lobe, randomly distributed. The nodules are millimetric and nonspecific, however pulmonary metastasis are suspected in the setting. For example nodules are seen in the right upper lobe series 3, images 36, 40, and 64, right middle lobe series 3, images 64 and 76, left lower lobe series 3, image 72, and right lower lobe series 3, image 105. no confluent consolidation or pleural effusion. The trachea and central bronchi are patent. Upper Abdomen: Assessed on con abdominal CT earlier today,  multiple liver masses. Current Musculoskeletal: Tiny sclerotic focus within T7, nonspecific but likely a bone island, too small to accurately characterize no destructive bone lesions. No chest wall soft tissue abnormalities. IMPRESSION: 1. Scattered tiny pulmonary nodules, largest 4 mm in the right middle lobe, nonspecific, however pulmonary metastasis are suspected in the setting of known malignancy. 2. Small mediastinal lymph nodes are not enlarged by size criteria, largest 8 mm, nonspecific. 3. Tiny sclerotic focus within T7, too small to accurately characterize. Electronically Signed   By: Keith Rake M.D.   On: 12/12/2021 22:30   US BIOPSY (LIVER)  Result Date: 12/14/2021 INDICATION: Mass descending colon with multiple liver lesions. EXAM: ULTRASOUND GUIDED CORE BIOPSY OF LIVER MEDICATIONS: None. ANESTHESIA/SEDATION: Moderate (conscious) sedation was employed during this procedure. A total of Versed 2.0 mg and Fentanyl 100 mcg was administered intravenously. Moderate Sedation Time: 14  minutes. The patient's level of consciousness and vital signs were monitored continuously by radiology nursing throughout the procedure under my direct supervision. PROCEDURE: The procedure, risks, benefits, and alternatives were explained to the patient. Questions regarding the procedure were encouraged and answered. The patient understands and consents to the procedure. A time-out was performed prior to initiating the procedure. Ultrasound was performed of the liver. The right abdominal wall was prepped with chlorhexidine in a sterile fashion, and a sterile drape was applied covering the operative field. A sterile gown and sterile gloves were used for the procedure. Local anesthesia was provided with 1% Lidocaine. Under ultrasound guidance, a 17 gauge trocar needle was advanced into the right lobe of the liver. After confirming needle tip position, 3 separate coaxial 18 gauge core biopsy samples were obtained. Material was submitted in formalin. Gel-Foam pledgets were advanced through the outer needle as the needle was retracted and removed. Additional ultrasound was performed. COMPLICATIONS: None immediate. FINDINGS: Multiple ill-defined hyperechoic lesions are seen throughout the liver parenchyma. Biopsy at the level of abnormal tissue in the right lobe yielded solid tissue. IMPRESSION: Ultrasound-guided core biopsy performed at the level of abnormal parenchyma in the right lobe of the liver. Electronically Signed   By: Aletta Edouard M.D.   On: 12/14/2021 16:31     Assessment and plan- Patient is a 46 y.o. female with newly diagnosed metastatic colon cancer with liver and possibly lung metastases  I again reviewed CT chest abdomen and pelvis images independently and discussed findings with the patient and her mother which shows primary descending colon mass with regional adenopathy as well as areas of liver metastases and subcentimeter lung nodules concerning for pulmonary metastases.  Baseline CEA  elevated at 577.  Ultrasound-guided biopsy consistent with adenocarcinoma compatible with colon primary.  Cells were positive for CK20 and CDX2.  Discussed that this is stage IV disease and she would require systemic chemotherapy.  Treatment would be palliative and not curative.  No role for primary surgery or radiation therapy at this time.  Standard of care would be backbone of FOLFOX chemotherapy which is given every 2 weeks until progression or toxicity.  It would be reasonable to add bevacizumab as well to FOLFOX regimen.  Patient plans to receive all her care at [facility] [place].  We will obtain release of information from the patient to send all these records to the cancer center at [facility].  We will also send out pathology slides over there for additional testing which can be performed there including comprehensive RAS testing, MSI testing and NGS testing as deemed appropriate.  Discussed risks  and benefits of chemotherapy including all but not limited to nausea, vomiting, low blood counts, risk of infections and hospitalization.  Treatment will be given with a palliative intent.  She will be getting further treatment at [place] cancer metastasized to liver [facility]) Staging form: Colon and Rectum, AJCC 8th Edition - Clinical stage from 12/19/2021: Stage IVB (cTX, cN1, pM1b) - Signed by Sindy Guadeloupe, MD on 12/19/2021     Visit Diagnosis 1. Colon cancer metastasized to liver ([place])   2. Goals of care, counseling/discussion      Dr. Randa Evens, MD, MPH [facility] [phone] 12/19/2021 3:09 PM

## 2021-12-21 ENCOUNTER — Telehealth: Payer: Self-pay | Admitting: *Deleted

## 2021-12-22 NOTE — Telephone Encounter (Signed)
I got a call from [place] center. Jennifer Ramirez has asked for info that was not on demographics. The only thing that was not right is that pt address on our forms was she lived in [place]. I had to call the pt and get her address [address] [name] [address]. I called back and spoke to [place] and she says there is a name in there with same date of birth but it does not match the one in the chart. Patient could not remember what addresses in the past. She states that pt moved about 8 times. They took the info and when she gets appt then if it is same person then they will link the 2 records together. We were told that pt should get a call 48 to 72 hours and it is business days. Pt aware of the time frame

## 2022-01-25 ENCOUNTER — Telehealth: Payer: Self-pay

## 2022-01-25 NOTE — Telephone Encounter (Signed)
I returned a call to [employer]. They was calling to get Dr.Rao information (address and phone number). They did state they got her scheduled.  ?

## 2024-01-23 IMAGING — CT CT CHEST W/O CM
2 of 4 series · 14 of 36 positions shown, 17 images · non-contrast
Comparison: No prior chest exams.

CLINICAL DATA: Colon cancer, metastatic, staging. Colonic mass on
CT earlier today.



[Series 2: thorax · axial · 0.85mm/px · z∈[-339,-89]mm · 11 of 149 slices shown, 14 images]
[im 12/149  mediastinal]
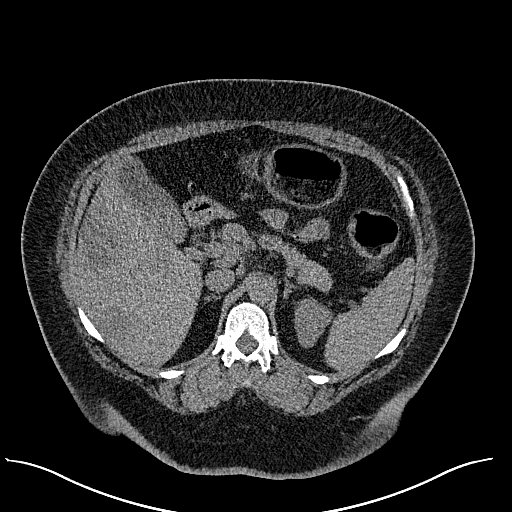
[im 12/149  lung]
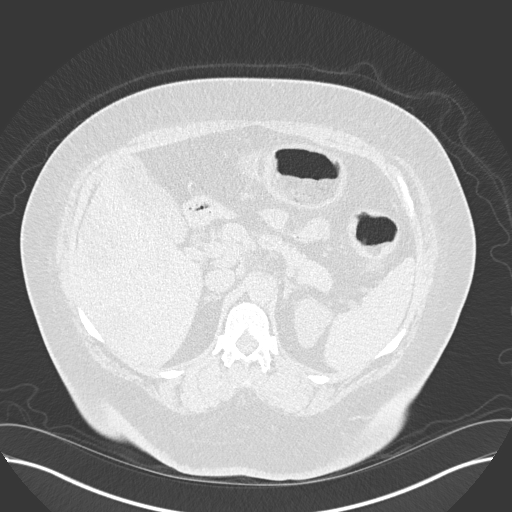
[im 23/149  lung]
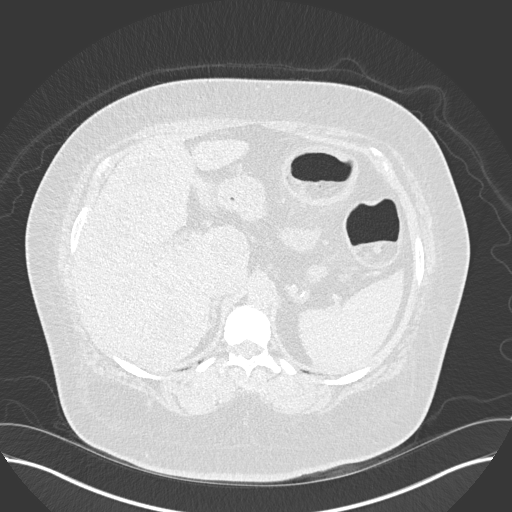
[im 35/149  lung]
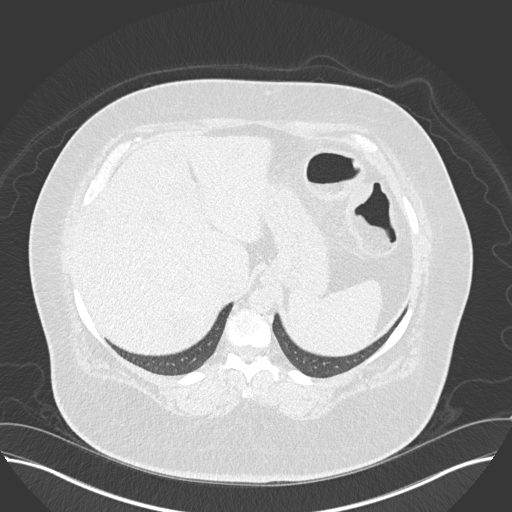
[im 46/149  lung]
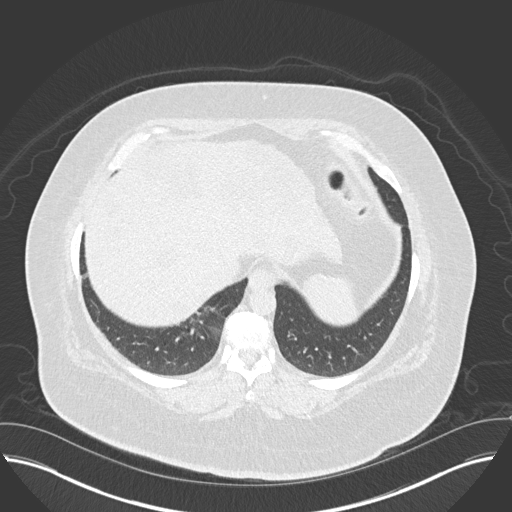
[im 57/149  mediastinal]
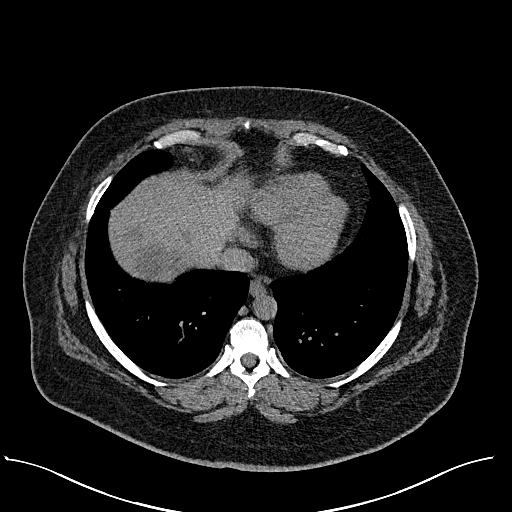
[im 57/149  lung]
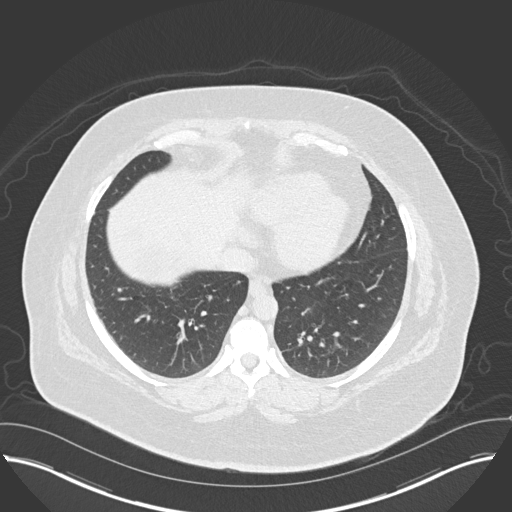
[im 80/149  lung]
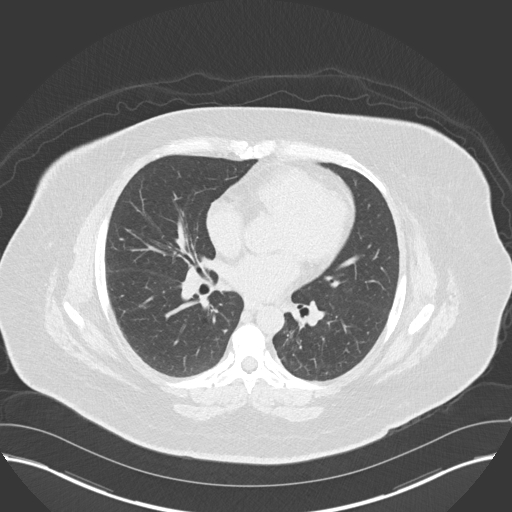
[im 92/149  lung]
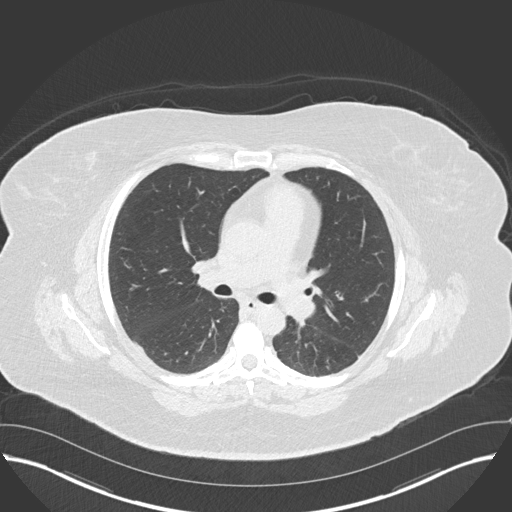
[im 103/149  lung]
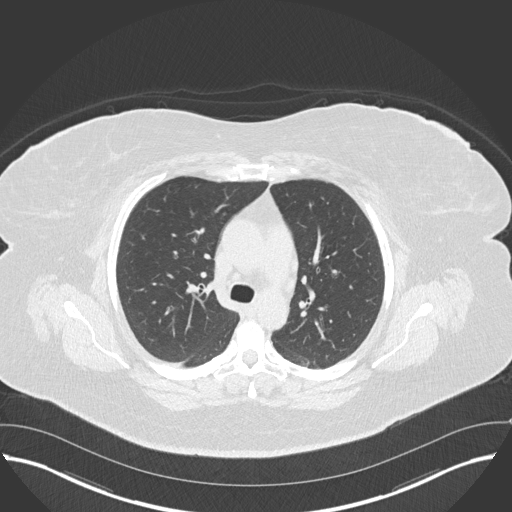
[im 114/149  mediastinal]
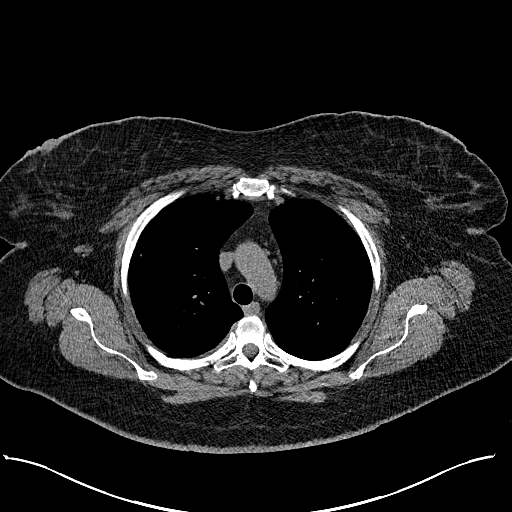
[im 114/149  lung]
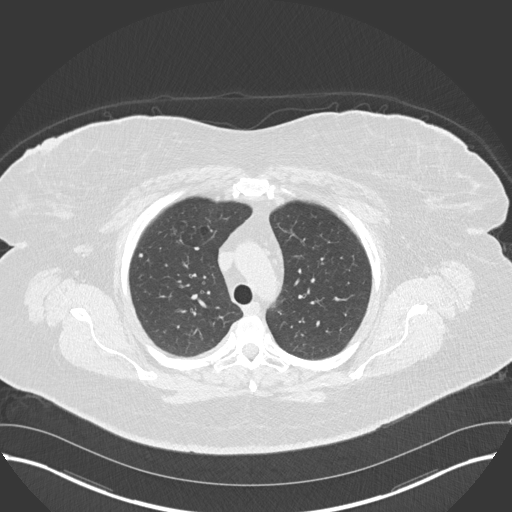
[im 126/149  lung]
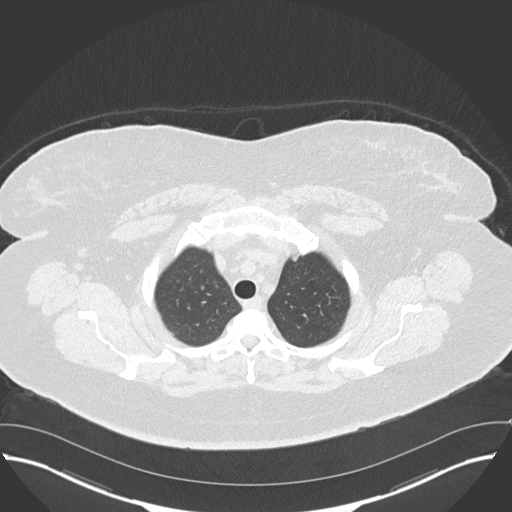
[im 137/149  lung]
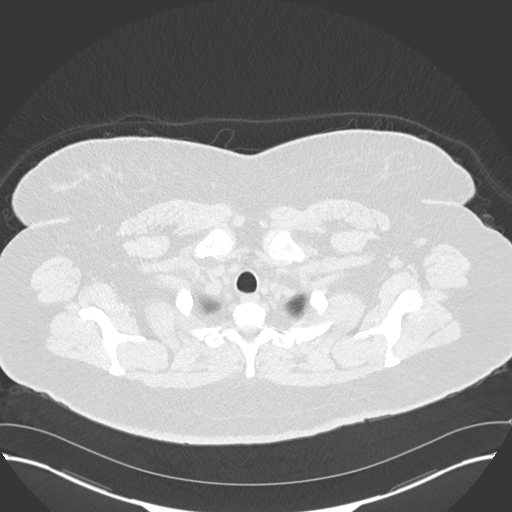

[Series 5: coronal · coronal · 0.62mm/px · 3 of 164 slices shown]
[im 33/164  lung]
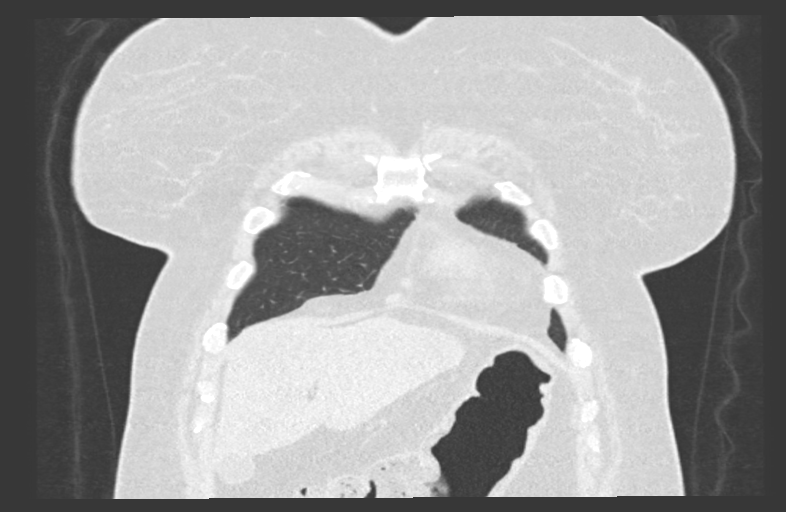
[im 66/164  lung]
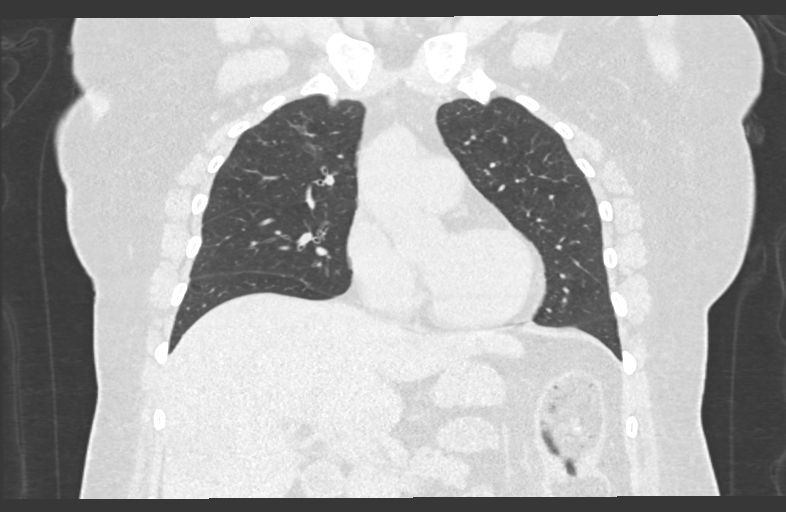
[im 98/164  lung]
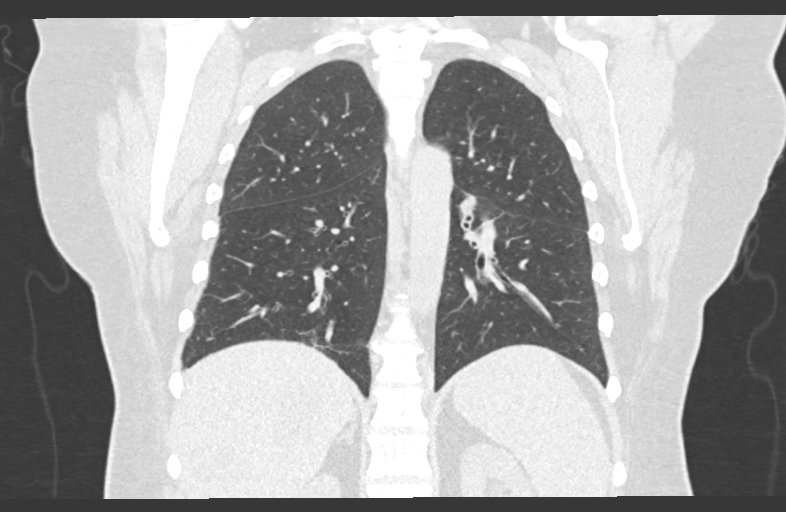

[14 of 36 positions shown; findings below may reference images not displayed]

FINDINGS: Cardiovascular: Normal caliber thoracic aorta. The heart is normal
in size. There is no pericardial effusion.

Mediastinum/Nodes: Small mediastinal lymph nodes measure up to 8 mm
in the prevascular space, series 2, image 46. Non-specific 8 mm
epicardial node series 2, image 88. Limited assessment for hilar
adenopathy on this unenhanced exam. No esophageal wall thickening.
No visualized thyroid nodule.

Lungs/Pleura: Scattered tiny pulmonary nodules both lungs, largest 4
mm in the right middle lobe, randomly distributed. The nodules are
millimetric and nonspecific, however pulmonary metastasis are
suspected in the setting. For example nodules are seen in the right
upper lobe series 3, images 36, 40, and 64, right middle lobe series
3, images 64 and 76, left lower lobe series 3, image 72, and right
lower lobe series 3, image 105. no confluent consolidation or
pleural effusion. The trachea and central bronchi are patent.

Upper Abdomen: Assessed on con abdominal CT earlier today, multiple
liver masses. Current

Musculoskeletal: Tiny sclerotic focus within T7, nonspecific but
likely a bone island, too small to accurately characterize no
destructive bone lesions. No chest wall soft tissue abnormalities.
IMPRESSION: 1. Scattered tiny pulmonary nodules, largest 4 mm in the right
middle lobe, nonspecific, however pulmonary metastasis are suspected
in the setting of known malignancy.
2. Small mediastinal lymph nodes are not enlarged by size criteria,
largest 8 mm, nonspecific.
3. Tiny sclerotic focus within T7, too small to accurately
characterize.
